# Patient Record
Sex: Male | Born: 1975 | Race: White | Hispanic: No | Marital: Married
Health system: Midwestern US, Community
[De-identification: ages and names within clinical notes are randomized; demographics above are authoritative.]

## PROBLEM LIST (undated history)

## (undated) DIAGNOSIS — Z8619 Personal history of other infectious and parasitic diseases: Secondary | ICD-10-CM

## (undated) DIAGNOSIS — D72819 Decreased white blood cell count, unspecified: Secondary | ICD-10-CM

## (undated) DIAGNOSIS — F411 Generalized anxiety disorder: Secondary | ICD-10-CM

## (undated) DIAGNOSIS — F429 Obsessive-compulsive disorder, unspecified: Secondary | ICD-10-CM

## (undated) DIAGNOSIS — I10 Essential (primary) hypertension: Secondary | ICD-10-CM

## (undated) DIAGNOSIS — F40243 Fear of flying: Secondary | ICD-10-CM

## (undated) DIAGNOSIS — F410 Panic disorder [episodic paroxysmal anxiety] without agoraphobia: Secondary | ICD-10-CM

## (undated) DIAGNOSIS — F32A Depression, unspecified: Secondary | ICD-10-CM

## (undated) DIAGNOSIS — F329 Major depressive disorder, single episode, unspecified: Secondary | ICD-10-CM

## (undated) DIAGNOSIS — R6882 Decreased libido: Secondary | ICD-10-CM

## (undated) DIAGNOSIS — T7840XA Allergy, unspecified, initial encounter: Secondary | ICD-10-CM

## (undated) HISTORY — DX: Panic disorder (episodic paroxysmal anxiety): F41.0

## (undated) HISTORY — DX: Essential (primary) hypertension: I10

## (undated) HISTORY — DX: Fear of flying: F40.243

## (undated) HISTORY — DX: Decreased libido: R68.82

## (undated) HISTORY — DX: Major depressive disorder, single episode, unspecified: F32.9

## (undated) HISTORY — DX: Depression, unspecified: F32.A

## (undated) HISTORY — DX: Decreased white blood cell count, unspecified: D72.819

## (undated) HISTORY — DX: Generalized anxiety disorder: F41.1

## (undated) HISTORY — DX: Allergy, unspecified, initial encounter: T78.40XA

## (undated) HISTORY — DX: Personal history of other infectious and parasitic diseases: Z86.19

## (undated) HISTORY — PX: VASECTOMY: SHX75

## (undated) HISTORY — DX: Obsessive-compulsive disorder, unspecified: F42.9

---

## 2001-09-15 HISTORY — PX: FUNCTIONAL ENDOSCOPIC SINUS SURGERY: SUR616

## 2008-09-15 HISTORY — PX: NASAL SINUS SURGERY: SHX719

## 2014-02-25 MED ORDER — IBUPROFEN 800 MG TAB
800 mg | ORAL_TABLET | Freq: Four times a day (QID) | ORAL | Status: AC | PRN
Start: 2014-02-25 — End: 2014-03-04

## 2014-02-25 NOTE — ED Notes (Signed)
Pt discharged by provider.

## 2014-02-25 NOTE — ED Notes (Signed)
Pt was playing racquetball and was hit in nose with ball at close range.   States septum moved to left after injury.  Hx of sinus surgery and septoplasty.

## 2014-02-25 NOTE — ED Notes (Signed)
Pt given ice pack.

## 2014-02-25 NOTE — ED Provider Notes (Signed)
Patient is a 38 y.o. male presenting with nasal problem. The history is provided by the patient.   Nasal Injury   The incident occurred less than 1 hour ago. He came to the ER via walk-in. The injury mechanism was a direct blow. The volume of blood lost was minimal. The pain is moderate. The pain has been constant since the injury. Pertinent negatives include no vomiting. He has tried nothing for the symptoms. There was no loss of consciousness.    38 yo M who was playing racketball and the ball came back and hit him in the head. Denies LOC. Now has swellin to upper lip and nose. ?small amount of blood. No NV. Has hx of sinus surgery x 2.     History reviewed. No pertinent past medical history.     Past Surgical History   Procedure Laterality Date   ??? Hx septoplasty           History reviewed. No pertinent family history.     History     Social History   ??? Marital Status: MARRIED     Spouse Name: N/A     Number of Children: N/A   ??? Years of Education: N/A     Occupational History   ??? Not on file.     Social History Main Topics   ??? Smoking status: Never Smoker    ??? Smokeless tobacco: Not on file   ??? Alcohol Use: Yes      Comment: once a week   ??? Drug Use: No   ??? Sexual Activity: Not on file     Other Topics Concern   ??? Not on file     Social History Narrative   ??? No narrative on file                  ALLERGIES: Review of patient's allergies indicates no known allergies.      Review of Systems   HENT: Positive for facial swelling and nosebleeds.    Respiratory: Negative for shortness of breath.    Gastrointestinal: Negative for nausea and vomiting.   Musculoskeletal: Negative for neck pain.   Skin: Positive for color change. Negative for wound.   Neurological: Negative for dizziness and headaches.       Filed Vitals:    02/25/14 1225   BP: 166/115   Pulse: 78   Temp: 98.5 ??F (36.9 ??C)   Resp: 16   Height: 5\' 8"  (1.727 m)   Weight: 78.019 kg (172 lb)   SpO2: 97%            Physical Exam   Constitutional: He is oriented  to person, place, and time. He appears well-developed and well-nourished. No distress.   HENT:   Head: Normocephalic.   Right Ear: External ear normal.   Left Ear: External ear normal.   ttp to bridge of nose no crepitus noted no septal hematoma noted  Upper lip midline swelling near frenulum    Eyes: Conjunctivae and EOM are normal. Pupils are equal, round, and reactive to light. Right eye exhibits no discharge. Left eye exhibits no discharge. No scleral icterus.   Neck: Normal range of motion. Neck supple. No tracheal deviation present.   Cardiovascular: Normal rate, normal heart sounds and intact distal pulses.    No murmur heard.  Pulmonary/Chest: Effort normal. No respiratory distress. He has no wheezes. He has no rales.   Abdominal: He exhibits no distension.   Musculoskeletal: Normal range of  motion.   Neurological: He is alert and oriented to person, place, and time. No cranial nerve deficit. Coordination normal.   Skin: Skin is warm. No rash noted. He is not diaphoretic. No erythema.   Psychiatric: He has a normal mood and affect. His behavior is normal. Judgment and thought content normal.   Nursing note and vitals reviewed.       MDM  Number of Diagnoses or Management Options  Diagnosis management comments: DD - bruise, frx        Amount and/or Complexity of Data Reviewed  Tests in the radiology section of CPT??: ordered    Risk of Complications, Morbidity, and/or Mortality  Presenting problems: low  Diagnostic procedures: low  Management options: low  General comments: Low suspicion for frx will get xray and dc accordingly         Procedures

## 2014-10-26 ENCOUNTER — Other Ambulatory Visit: Payer: Self-pay | Admitting: Family Medicine

## 2014-10-26 ENCOUNTER — Ambulatory Visit
Admission: RE | Admit: 2014-10-26 | Discharge: 2014-10-26 | Disposition: A | Discharge status: Home / Self Care | Payer: BC Managed Care – PPO | Source: Ambulatory Visit | Attending: Family Medicine | Admitting: Family Medicine

## 2014-10-26 DIAGNOSIS — R609 Edema, unspecified: Secondary | ICD-10-CM

## 2014-10-26 DIAGNOSIS — R52 Pain, unspecified: Secondary | ICD-10-CM

## 2015-12-21 DIAGNOSIS — F410 Panic disorder [episodic paroxysmal anxiety] without agoraphobia: Secondary | ICD-10-CM | POA: Diagnosis not present

## 2015-12-31 DIAGNOSIS — I10 Essential (primary) hypertension: Secondary | ICD-10-CM | POA: Diagnosis not present

## 2015-12-31 DIAGNOSIS — M792 Neuralgia and neuritis, unspecified: Secondary | ICD-10-CM | POA: Diagnosis not present

## 2015-12-31 DIAGNOSIS — F419 Anxiety disorder, unspecified: Secondary | ICD-10-CM | POA: Diagnosis not present

## 2015-12-31 DIAGNOSIS — F329 Major depressive disorder, single episode, unspecified: Secondary | ICD-10-CM | POA: Diagnosis not present

## 2016-01-07 DIAGNOSIS — F411 Generalized anxiety disorder: Secondary | ICD-10-CM | POA: Diagnosis not present

## 2016-01-07 DIAGNOSIS — I10 Essential (primary) hypertension: Secondary | ICD-10-CM | POA: Diagnosis not present

## 2016-01-07 DIAGNOSIS — K588 Other irritable bowel syndrome: Secondary | ICD-10-CM | POA: Diagnosis not present

## 2016-01-10 DIAGNOSIS — F411 Generalized anxiety disorder: Secondary | ICD-10-CM | POA: Diagnosis not present
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
## 2016-01-17 DIAGNOSIS — F411 Generalized anxiety disorder: Secondary | ICD-10-CM | POA: Diagnosis not present

## 2016-01-22 DIAGNOSIS — I10 Essential (primary) hypertension: Secondary | ICD-10-CM | POA: Diagnosis not present

## 2016-01-22 DIAGNOSIS — F411 Generalized anxiety disorder: Secondary | ICD-10-CM | POA: Diagnosis not present

## 2016-01-22 DIAGNOSIS — M792 Neuralgia and neuritis, unspecified: Secondary | ICD-10-CM | POA: Diagnosis not present

## 2016-01-24 DIAGNOSIS — F411 Generalized anxiety disorder: Secondary | ICD-10-CM | POA: Diagnosis not present

## 2016-01-31 DIAGNOSIS — F411 Generalized anxiety disorder: Secondary | ICD-10-CM | POA: Diagnosis not present

## 2016-02-14 DIAGNOSIS — F411 Generalized anxiety disorder: Secondary | ICD-10-CM | POA: Diagnosis not present

## 2016-02-21 DIAGNOSIS — F411 Generalized anxiety disorder: Secondary | ICD-10-CM | POA: Diagnosis not present

## 2016-02-27 DIAGNOSIS — M25572 Pain in left ankle and joints of left foot: Secondary | ICD-10-CM | POA: Diagnosis not present

## 2016-06-02 DIAGNOSIS — R05 Cough: Secondary | ICD-10-CM | POA: Diagnosis not present

## 2016-06-02 DIAGNOSIS — F418 Other specified anxiety disorders: Secondary | ICD-10-CM | POA: Diagnosis not present

## 2016-06-02 DIAGNOSIS — J0141 Acute recurrent pansinusitis: Secondary | ICD-10-CM | POA: Diagnosis not present

## 2016-06-10 DIAGNOSIS — R197 Diarrhea, unspecified: Secondary | ICD-10-CM | POA: Diagnosis not present

## 2016-06-10 DIAGNOSIS — F411 Generalized anxiety disorder: Secondary | ICD-10-CM | POA: Diagnosis not present

## 2016-06-17 DIAGNOSIS — A0472 Enterocolitis due to Clostridium difficile, not specified as recurrent: Secondary | ICD-10-CM | POA: Diagnosis not present

## 2016-06-17 DIAGNOSIS — R238 Other skin changes: Secondary | ICD-10-CM | POA: Diagnosis not present

## 2016-06-20 DIAGNOSIS — A0472 Enterocolitis due to Clostridium difficile, not specified as recurrent: Secondary | ICD-10-CM | POA: Diagnosis not present

## 2016-06-20 DIAGNOSIS — R238 Other skin changes: Secondary | ICD-10-CM | POA: Diagnosis not present

## 2016-07-05 DIAGNOSIS — A0472 Enterocolitis due to Clostridium difficile, not specified as recurrent: Secondary | ICD-10-CM | POA: Diagnosis not present

## 2016-07-05 DIAGNOSIS — F418 Other specified anxiety disorders: Secondary | ICD-10-CM | POA: Diagnosis not present

## 2016-07-06 DIAGNOSIS — Z114 Encounter for screening for human immunodeficiency virus [HIV]: Secondary | ICD-10-CM | POA: Diagnosis not present

## 2016-07-06 DIAGNOSIS — R5383 Other fatigue: Secondary | ICD-10-CM | POA: Diagnosis not present

## 2016-07-06 DIAGNOSIS — E559 Vitamin D deficiency, unspecified: Secondary | ICD-10-CM | POA: Diagnosis not present

## 2016-07-06 DIAGNOSIS — Z0000 Encounter for general adult medical examination without abnormal findings: Secondary | ICD-10-CM | POA: Diagnosis not present

## 2016-07-06 DIAGNOSIS — M25571 Pain in right ankle and joints of right foot: Secondary | ICD-10-CM | POA: Diagnosis not present

## 2016-07-16 DIAGNOSIS — F410 Panic disorder [episodic paroxysmal anxiety] without agoraphobia: Secondary | ICD-10-CM | POA: Diagnosis not present

## 2016-07-23 DIAGNOSIS — F5101 Primary insomnia: Secondary | ICD-10-CM | POA: Diagnosis not present

## 2016-07-23 DIAGNOSIS — F411 Generalized anxiety disorder: Secondary | ICD-10-CM | POA: Diagnosis not present

## 2016-07-23 DIAGNOSIS — I10 Essential (primary) hypertension: Secondary | ICD-10-CM | POA: Diagnosis not present

## 2016-07-23 DIAGNOSIS — Z23 Encounter for immunization: Secondary | ICD-10-CM | POA: Diagnosis not present

## 2016-07-29 DIAGNOSIS — Z8619 Personal history of other infectious and parasitic diseases: Secondary | ICD-10-CM | POA: Diagnosis not present

## 2016-09-26 DIAGNOSIS — N528 Other male erectile dysfunction: Secondary | ICD-10-CM | POA: Diagnosis not present

## 2016-09-26 DIAGNOSIS — Z3009 Encounter for other general counseling and advice on contraception: Secondary | ICD-10-CM | POA: Diagnosis not present

## 2016-10-13 DIAGNOSIS — I10 Essential (primary) hypertension: Secondary | ICD-10-CM | POA: Diagnosis not present

## 2016-10-13 DIAGNOSIS — F5101 Primary insomnia: Secondary | ICD-10-CM | POA: Diagnosis not present

## 2016-10-13 DIAGNOSIS — F411 Generalized anxiety disorder: Secondary | ICD-10-CM | POA: Diagnosis not present

## 2016-10-14 DIAGNOSIS — E7800 Pure hypercholesterolemia, unspecified: Secondary | ICD-10-CM | POA: Diagnosis not present

## 2016-10-14 DIAGNOSIS — E0590 Thyrotoxicosis, unspecified without thyrotoxic crisis or storm: Secondary | ICD-10-CM | POA: Diagnosis not present

## 2016-10-14 DIAGNOSIS — I10 Essential (primary) hypertension: Secondary | ICD-10-CM | POA: Diagnosis not present

## 2016-10-14 DIAGNOSIS — F411 Generalized anxiety disorder: Secondary | ICD-10-CM | POA: Diagnosis not present

## 2016-10-16 DIAGNOSIS — F411 Generalized anxiety disorder: Secondary | ICD-10-CM | POA: Diagnosis not present

## 2016-10-21 DIAGNOSIS — L0591 Pilonidal cyst without abscess: Secondary | ICD-10-CM | POA: Diagnosis not present

## 2016-10-22 DIAGNOSIS — F410 Panic disorder [episodic paroxysmal anxiety] without agoraphobia: Secondary | ICD-10-CM | POA: Diagnosis not present

## 2016-10-24 DIAGNOSIS — Z3009 Encounter for other general counseling and advice on contraception: Secondary | ICD-10-CM | POA: Diagnosis not present

## 2016-10-27 ENCOUNTER — Encounter: Payer: Self-pay | Admitting: General Surgery

## 2016-11-03 DIAGNOSIS — S61451A Open bite of right hand, initial encounter: Secondary | ICD-10-CM | POA: Diagnosis not present

## 2016-11-03 DIAGNOSIS — M25541 Pain in joints of right hand: Secondary | ICD-10-CM | POA: Diagnosis not present

## 2016-11-03 DIAGNOSIS — W540XXA Bitten by dog, initial encounter: Secondary | ICD-10-CM | POA: Diagnosis not present

## 2016-11-04 ENCOUNTER — Ambulatory Visit: Payer: Self-pay | Admitting: General Surgery

## 2016-11-12 ENCOUNTER — Ambulatory Visit: Payer: Self-pay | Admitting: General Surgery

## 2016-12-02 ENCOUNTER — Encounter: Payer: Self-pay | Admitting: Family Medicine

## 2016-12-15 ENCOUNTER — Encounter: Payer: Self-pay | Admitting: Family Medicine

## 2016-12-15 ENCOUNTER — Ambulatory Visit (INDEPENDENT_AMBULATORY_CARE_PROVIDER_SITE_OTHER): Payer: BLUE CROSS/BLUE SHIELD | Admitting: Family Medicine

## 2016-12-15 DIAGNOSIS — F32A Depression, unspecified: Secondary | ICD-10-CM | POA: Insufficient documentation

## 2016-12-15 DIAGNOSIS — F411 Generalized anxiety disorder: Secondary | ICD-10-CM | POA: Diagnosis not present

## 2016-12-15 DIAGNOSIS — K589 Irritable bowel syndrome without diarrhea: Secondary | ICD-10-CM | POA: Diagnosis not present

## 2016-12-15 DIAGNOSIS — F3342 Major depressive disorder, recurrent, in full remission: Secondary | ICD-10-CM

## 2016-12-15 DIAGNOSIS — I10 Essential (primary) hypertension: Secondary | ICD-10-CM

## 2016-12-15 DIAGNOSIS — M792 Neuralgia and neuritis, unspecified: Secondary | ICD-10-CM | POA: Insufficient documentation

## 2016-12-15 DIAGNOSIS — F329 Major depressive disorder, single episode, unspecified: Secondary | ICD-10-CM | POA: Insufficient documentation

## 2016-12-15 MED ORDER — CLONAZEPAM 0.5 MG PO TABS: ORAL_TABLET | ORAL | 0 refills | Status: DC

## 2016-12-15 NOTE — Assessment & Plan Note (Signed)
S: controlled on losartan HCT 100-12.5mg . Before visit was 143/83. Usually 130s to 140s over 80s.  BP Readings from Last 3 Encounters:  12/15/16 128/88  A/P:Continue current meds:  Doing reasonably well- would prefer all home #s under 140- discussed making sure to take when relaxed and rested

## 2016-12-15 NOTE — Assessment & Plan Note (Signed)
Gabapentin 300mg  BID for mouth nerve pain - through primary care but advised through dentistry. Would be willing to refill

## 2016-12-15 NOTE — Assessment & Plan Note (Signed)
For IBS will be seeing Functional medicine specialist- diet and IBS related visit coming up as well. Taking lomotil 4x a day. We did not refill this today but would be willing.

## 2016-12-15 NOTE — Patient Instructions (Signed)
We will start slow taper on clonazepam in anticipation of your visit with [facility]. Let's reduce to full tablet in the morning, half tablet in afternoon, and full tablet in the afternoon.   Glad you weaned yourself off the [place]- that seems like a positive step  Blood pressure better on repeat  We should see each other at some point for your yearly physical- just make sure at least a year out from last visit with [facility] for physical

## 2016-12-15 NOTE — Progress Notes (Signed)
Pre visit review using our clinic review tool, if applicable. No additional management support is needed unless otherwise documented below in the visit note.[id number]

## 2016-12-15 NOTE — Assessment & Plan Note (Addendum)
Depression S: Patient has had long term issues with anxiety. He was following with Letta Moynahan and prior PCP and at one point had rx written for clonazepam within 15 days by both office and ultimately was discharged from PCPs office. He was given this clonazepam refill when asking for a different short term benzo from PCP. Patient didn't feel like he ever got to explain situation to his PCP. Has been looking for 2 years for PCP.   Has had long term anxiety issues and has been on clonazepam for 15 years. Also on wellbutrin 150mg  XR and pristiq 100mg . Issues started around fear of flying and nhad to fly frequently with work.   He is wanting to transition off of klonopin due to his concerns of long term risks. He was also concerned about [place] and Weaned off [place] last month. Last dose was 3 weeks ago. Doing melatonin. Has some old trazodone for really bad nights.   For this wean, has May 9th appointment with [facility] Psychiatry Dr. Nyoka Cowden and Dr. Wynell Balloon on may 9th [phone]. . Functional medicine specialist- diet and IBS related visit coming up as well. Taking lomotil 4x a day. For IBS  Seeing Psychotherapist [facility] Dr. Henderson [place] in [place] late April.   Depression has been well controlled as has anxiety on this regimen fortunately. No SI.  A/P: Reviewed [place] database. Last fill of clonazepam #90 was 11/20/16 and he states he has a week left. Not taking xanax anymore.  He asks about starting the wean at this point. We planned for klonopin 0.5mg  AM, 0.25mg  afternoon, 0.5mg  PM over next month and if he fills this when other medicine runs out will get him to his next appointment.   Also scanned in document that he wrote to me explaining situation.

## 2016-12-15 NOTE — Progress Notes (Signed)
Phone: [phone]  Subjective:  Patient presents today to establish care.  Prior patient of [facility] physicians Dr. Dorthy Cooler. Chief complaint-noted.   See problem oriented charting  The following were reviewed and entered/updated in epic: Past Medical History:  Diagnosis Date  . Allergy    nasal lavave 3x a day. in past multiple meds- claritin, singulair, nasal spray.   . Depression    wellbutrin '150mg'$  XR, pristiq '100mg'$ . ambien '10mg'$  for sleep. clonazepam 1 tablet TID prn  . GAD (generalized anxiety disorder)    wellbutrin '150mg'$  XR, pristiq '100mg'$ . ambien '10mg'$  for sleep. clonazepam 1 tablet TID prn  . History of Clostridium difficile infection   . Hypertension    losartan HCT 100-12.'5mg'$  daily   Patient Active Problem List   Diagnosis Date Noted  . GAD (generalized anxiety disorder)     Priority: High  . IBS (irritable bowel syndrome) 12/15/2016    Priority: Medium  . Depression     Priority: Medium  . Hypertension     Priority: Medium  . Nerve pain 12/15/2016    Priority: Low   Past Surgical History:  Procedure Laterality Date  . FUNCTIONAL ENDOSCOPIC SINUS SURGERY  2003  . NASAL SINUS SURGERY  2010   baloonoplasty  . VASECTOMY      Family History  Problem Relation Age of Onset  . Other Mother     smoldering myeloma. precursor to multiple myeloma.   . Hypertension Father   . Anxiety disorder Father   . Colon cancer Father     53  . Lupus Sister     Medications- reviewed and updated Current Outpatient Prescriptions  Medication Sig Dispense Refill  . buPROPion (WELLBUTRIN XL) 150 MG 24 hr tablet Take 150 mg by mouth daily.    . clonazePAM (KLONOPIN) 0.5 MG tablet Take 1 tablet in AM, half tablet in afternoon, 1 tablet in PM 85 tablet 0  . desvenlafaxine (PRISTIQ) 100 MG 24 hr tablet Take 100 mg by mouth daily.    . diphenoxylate-atropine (LOMOTIL) 2.5-0.025 MG tablet Take 1 tablet by mouth 4 (four) times daily.    [place] Kitchen gabapentin (NEURONTIN) 300 MG capsule Take 300  mg by mouth 2 (two) times daily.    Mar[place] Kitchen losartan-hydrochlorothiazide (HYZAAR) 100-12.5 MG tablet Take 1 tablet by mouth daily.    . sildenafil (REVATIO) 20 MG tablet Take 20 mg by mouth as needed.    . traZODone (DESYREL) 50 MG tablet Take 50 mg by mouth at bedtime as needed for sleep.     No current facility-administered medications for this visit.     Allergies-reviewed and updated No Known Allergies  Social History   Social History  . Marital status: Married    Spouse name: N/A  . Number of children: N/A  . Years of education: N/A   Social History Main Topics  . Smoking status: Never Smoker  . Smokeless tobacco: Never Used  . Alcohol use 0.6 oz/week    1 Standard drinks or equivalent per week  . Drug use: No  . Sexual activity: Yes   Other Topics Concern  . None   Social History Narrative   Married 2002. 2 daughters sophie 62 and caroline 31 years old in 2018.       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  HENT: Negative for hearing loss and tinnitus.   Eyes: Negative for blurred vision and double vision.  Respiratory: Negative for cough and hemoptysis.   Cardiovascular: Negative for chest pain and palpitations.  Gastrointestinal: Negative for heartburn and nausea.  Genitourinary: Negative for dysuria and urgency.  Musculoskeletal: Negative for myalgias and neck pain.  Skin: Negative for itching and rash.  Neurological: Negative for dizziness and headaches.  Endo/Heme/Allergies: Negative for polydipsia. Does not bruise/bleed easily.  Psychiatric/Behavioral: Negative for depression, substance abuse and suicidal ideas.   Objective: BP 128/88   Pulse 100   Temp 98.2 F (36.8 C) (Oral)   Ht 5' 7.5" (1.715 m)   Wt 191 lb  12.8 oz (87 kg)   SpO2 96%   BMI 29.60 kg/m  Gen: NAD, resting comfortably HEENT: Mucous membranes are moist. Oropharynx normal. TM normal. Eyes: sclera and lids normal, PERRLA Neck: no thyromegaly, no cervical lymphadenopathy CV: RRR no murmurs rubs or gallops Lungs: CTAB no crackles, wheeze, rhonchi Abdomen: soft/nontender/nondistended/normal bowel sounds. No rebound or guarding.  Ext: no edema Skin: warm, dry Neuro: 5/5 strength in upper and lower extremities, normal gait, normal reflexes  Assessment/Plan:  Hypertension S: controlled on losartan HCT 100-12.'5mg'$ . Before visit was 143/83. Usually 130s to 140s over 80s.  BP Readings from Last 3 Encounters:  12/15/16 128/88  A/P:Continue current meds:  Doing reasonably well- would prefer all home #s under 140- discussed making sure to take when relaxed and rested  IBS (irritable bowel syndrome) For IBS will be seeing Functional medicine specialist- diet and IBS related visit coming up as well. Taking lomotil 4x a day. We did not refill this today but would be willing.   Nerve pain Gabapentin '300mg'$  BID for mouth nerve pain - through primary care but advised through dentistry. Would be willing to refill  GAD (generalized anxiety disorder) Depression S: Patient has had long term issues with anxiety. He was following with Letta Moynahan and prior PCP and at one point had rx written for clonazepam within 15 days by both office and ultimately was discharged from PCPs office. He was given this clonazepam refill when asking for a different short term benzo from PCP. Patient didn't feel like he ever got to explain situation to his PCP. Has been looking for 2 years for PCP.   Has had long term anxiety issues and has been on clonazepam for 15 years. Also on wellbutrin '150mg'$  XR and pristiq '100mg'$ . Issues started around fear of flying and nhad to fly frequently with work.   He is wanting to transition off of klonopin due to his concerns of long  term risks. He was also concerned about [place] and Weaned off [place] last month. Last dose was 3 weeks ago. Doing melatonin. Has some old trazodone for really bad nights.   For this wean, has May 9th appointment with [facility] Psychiatry Dr. Nyoka Cowden and Dr. Wynell Balloon on may 9th [phone]. . Functional medicine specialist- diet and IBS related visit coming up as well. Taking lomotil 4x a day. For IBS  Seeing Psychotherapist [facility] Dr. Henderson [place] in [place] late April.   Depression has been well controlled as has anxiety on this regimen fortunately. No SI.  A/P: Reviewed [place] database. Last fill of clonazepam #90 was 11/20/16 and he states he has a week left. Not taking xanax anymore.  He asks about starting the wean at this point. We  planned for klonopin 0.'5mg'$  AM, 0.'25mg'$  afternoon, 0.'5mg'$  PM over next month and if he fills this when other medicine runs out will get him to his next appointment.   Also scanned in document that he wrote to me explaining situation.   Physical within next few months   Meds ordered this encounter  Medications  . desvenlafaxine (PRISTIQ) 100 MG 24 hr tablet    Sig: Take 100 mg by mouth daily.  [place] Kitchen buPROPion (WELLBUTRIN XL) 150 MG 24 hr tablet    Sig: Take 150 mg by mouth daily.  [place] Kitchen gabapentin (NEURONTIN) 300 MG capsule    Sig: Take 300 mg by mouth 2 (two) times daily.  . diphenoxylate-atropine (LOMOTIL) 2.5-0.025 MG tablet    Sig: Take 1 tablet by mouth 4 (four) times daily.  Mar[place] Kitchen losartan-hydrochlorothiazide (HYZAAR) 100-12.5 MG tablet    Sig: Take 1 tablet by mouth daily.  . sildenafil (REVATIO) 20 MG tablet    Sig: Take 20 mg by mouth as needed.  Mar[place] Kitchen DISCONTD: clonazePAM (KLONOPIN) 0.5 MG tablet    Sig: Take 0.5 mg by mouth 3 (three) times daily as needed for anxiety.  . traZODone (DESYREL) 50 MG tablet    Sig: Take 50 mg by mouth at bedtime as needed for sleep.  . clonazePAM (KLONOPIN) 0.5 MG tablet    Sig: Take 1 tablet in AM, half  tablet in afternoon, 1 tablet in PM    Dispense:  85 tablet    Refill:  0   Return precautions advised. Garret Reddish, MD

## 2016-12-24 ENCOUNTER — Encounter: Payer: Self-pay | Admitting: Family Medicine

## 2016-12-25 ENCOUNTER — Encounter: Payer: Self-pay | Admitting: *Deleted

## 2016-12-29 ENCOUNTER — Other Ambulatory Visit: Payer: Self-pay

## 2016-12-29 MED ORDER — LOSARTAN POTASSIUM-HCTZ 100-12.5 MG PO TABS: 1.0000 | ORAL_TABLET | Freq: Every day | ORAL | 3 refills | Status: DC

## 2016-12-29 MED ORDER — GABAPENTIN 300 MG PO CAPS: 300.0000 mg | ORAL_CAPSULE | Freq: Two times a day (BID) | ORAL | 3 refills | Status: AC

## 2016-12-29 MED ORDER — SILDENAFIL CITRATE 20 MG PO TABS: 20.0000 mg | ORAL_TABLET | ORAL | 3 refills | Status: DC | PRN

## 2017-01-19 ENCOUNTER — Encounter: Payer: Self-pay | Admitting: Family Medicine

## 2017-01-19 ENCOUNTER — Other Ambulatory Visit: Payer: Self-pay

## 2017-01-19 MED ORDER — CLONAZEPAM 0.5 MG PO TABS: ORAL_TABLET | ORAL | 0 refills | Status: AC

## 2017-01-21 DIAGNOSIS — F429 Obsessive-compulsive disorder, unspecified: Secondary | ICD-10-CM | POA: Diagnosis not present

## 2017-01-21 DIAGNOSIS — F410 Panic disorder [episodic paroxysmal anxiety] without agoraphobia: Secondary | ICD-10-CM | POA: Diagnosis not present

## 2017-01-29 ENCOUNTER — Encounter: Payer: Self-pay | Admitting: Family Medicine

## 2017-01-29 ENCOUNTER — Other Ambulatory Visit: Payer: Self-pay | Admitting: Family Medicine

## 2017-01-29 MED ORDER — HYDROXYZINE HCL 10 MG PO TABS
10.0000 mg | ORAL_TABLET | Freq: Three times a day (TID) | ORAL | 0 refills | Status: DC | PRN
Start: 2017-01-29 — End: 2018-10-04

## 2017-01-29 NOTE — Progress Notes (Signed)
atararax

## 2017-02-03 ENCOUNTER — Encounter: Payer: Self-pay | Admitting: Family Medicine

## 2017-02-11 ENCOUNTER — Ambulatory Visit (INDEPENDENT_AMBULATORY_CARE_PROVIDER_SITE_OTHER): Payer: BLUE CROSS/BLUE SHIELD | Admitting: Family Medicine

## 2017-02-11 ENCOUNTER — Encounter: Payer: Self-pay | Admitting: Gastroenterology

## 2017-02-11 ENCOUNTER — Encounter: Payer: Self-pay | Admitting: Family Medicine

## 2017-02-11 VITALS — BP 128/84 | HR 107 | Temp 98.8°F | Ht 67.5 in | Wt 195.0 lb

## 2017-02-11 DIAGNOSIS — L649 Androgenic alopecia, unspecified: Secondary | ICD-10-CM | POA: Diagnosis not present

## 2017-02-11 DIAGNOSIS — Z83719 Family history of colon polyps, unspecified: Secondary | ICD-10-CM

## 2017-02-11 DIAGNOSIS — Z8371 Family history of colonic polyps: Secondary | ICD-10-CM | POA: Diagnosis not present

## 2017-02-11 DIAGNOSIS — F3342 Major depressive disorder, recurrent, in full remission: Secondary | ICD-10-CM

## 2017-02-11 DIAGNOSIS — Z23 Encounter for immunization: Secondary | ICD-10-CM | POA: Diagnosis not present

## 2017-02-11 NOTE — Patient Instructions (Signed)
We opted out of propecia just with need to take lifelong  Can use rogaine type product but same issue there- may have significant loss if stop  Consider [facility] consult for timing of first colonoscopy. I think 45 would be reasonable.   Monitor spot on forehead at each physical

## 2017-02-11 NOTE — Assessment & Plan Note (Signed)
[facility] at [facility] now- Off wellbutrin, tapering klonopin, quit ambien- trazodone in its place. Xanax for flying.

## 2017-02-11 NOTE — Assessment & Plan Note (Signed)
S: patient with hair loss gradually over last several years. He is concerned this contributes to sun exposure due to less area of coverage. He is aware of need for sunscreen and is regularly using this. He asks if a spot on his left forehead is an actinic keratosis- which his father has had many of.   He also asks about Rogaine which he has been using as well as propecia A/P: long conversation about options today. He is concerned of risks of loss if he doesn't permanently take Rogaine or Propecia/finasteride (also worried abotu sexual side effects) and ultimately opts out. As for 2.5 mm lesion- appears to be small seborrheic keratosis and we will monitor this.

## 2017-02-11 NOTE — Assessment & Plan Note (Signed)
Dad with tubular adenoma age 41. He asks about early screening for colon cancer and we discussed age 3 likely reasonable. He is about to turn 41 and wonders about earlier screening. After visit, discovered new ACS guidelines of starting at age 80 - called him to inform of this.   He is considering a GI consult to consider earlier than age 38 screening. I think it is reasonable for him to sit down and discuss this. I advised [facility].

## 2017-02-11 NOTE — Progress Notes (Signed)
Subjective:  Jose Berry is a 41 y.o. year old very pleasant male patient who presents for/with See problem oriented charting ROS- complains of slow long term gradual hair loss in male pattern. No erythematous or scaly lesions   Past Medical History-  Patient Active Problem List   Diagnosis Date Noted  . GAD (generalized anxiety disorder)     Priority: High  . IBS (irritable bowel syndrome) 12/15/2016    Priority: Medium  . Depression     Priority: Medium  . Hypertension     Priority: Medium  . Nerve pain 12/15/2016    Priority: Low    Medications- reviewed and updated Current Outpatient Prescriptions  Medication Sig Dispense Refill  . ALPRAZolam (XANAX) 0.5 MG tablet Take 0.5 mg by mouth as needed. Takes prior to flights    . clonazePAM (KLONOPIN) 0.5 MG tablet Take 1 tablet in AM, half tablet in afternoon, 1 tablet in PM 4 tablet 0  . desvenlafaxine (PRISTIQ) 100 MG 24 hr tablet Take 100 mg by mouth daily.    . diphenoxylate-atropine (LOMOTIL) 2.5-0.025 MG tablet Take 1 tablet by mouth 4 (four) times daily.    [place] Kitchen gabapentin (NEURONTIN) 300 MG capsule Take 1 capsule (300 mg total) by mouth 2 (two) times daily. 90 capsule 3  . hydrOXYzine (ATARAX/VISTARIL) 10 MG tablet Take 1 tablet (10 mg total) by mouth 3 (three) times daily as needed. 30 tablet 0  . losartan-hydrochlorothiazide (HYZAAR) 100-12.5 MG tablet Take 1 tablet by mouth daily. 90 tablet 3  . sildenafil (REVATIO) 20 MG tablet Take 1 tablet (20 mg total) by mouth as needed. 30 tablet 3  . traZODone (DESYREL) 50 MG tablet Take 50 mg by mouth at bedtime as needed for sleep.    Mar[place] Kitchen buPROPion (WELLBUTRIN XL) 150 MG 24 hr tablet Take 150 mg by mouth daily.     No current facility-administered medications for this visit.     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 He is concerned this contributes to sun exposure due to less area of coverage. He is aware of need for sunscreen and is regularly using this. He asks if a spot on his left forehead is an actinic keratosis- which his father has had many of.   He also asks about Rogaine which he has been using as well as propecia A/P: long conversation about options today. He is concerned of risks of loss if he doesn't permanently take Rogaine or Propecia/finasteride (also worried abotu sexual side effects) and ultimately opts out. As for 2.5 mm lesion- appears to be small seborrheic keratosis and we will monitor this.   Family history of colonic polyps Dad with tubular adenoma age 45. He asks about early screening for colon cancer and we discussed age 56 likely reasonable. He is about to turn 41 and wonders about earlier screening. After visit, discovered new ACS guidelines of starting at age 39 - called him to inform of this.  He is considering a GI consult to consider earlier than age 40 screening. I think it is reasonable for him to sit down and discuss this. I advised [facility].   Orders Placed This Encounter  Procedures  . Tdap vaccine greater than or equal to 7yo IM    Meds ordered this encounter  Medications  . ALPRAZolam (XANAX) 0.5 MG tablet    Sig: Take 0.5 mg by mouth as needed. Takes prior to flights    Return precautions advised.  Garret Reddish, MD

## 2017-02-16 ENCOUNTER — Encounter: Payer: Self-pay | Admitting: Family Medicine

## 2017-02-21 DIAGNOSIS — K580 Irritable bowel syndrome with diarrhea: Secondary | ICD-10-CM | POA: Diagnosis not present

## 2017-02-23 ENCOUNTER — Encounter: Payer: Self-pay | Admitting: Family Medicine

## 2017-02-23 ENCOUNTER — Other Ambulatory Visit: Payer: Self-pay | Admitting: *Deleted

## 2017-02-23 MED ORDER — SILDENAFIL CITRATE 20 MG PO TABS: 20.0000 mg | ORAL_TABLET | ORAL | 0 refills | Status: DC | PRN

## 2017-02-23 NOTE — Telephone Encounter (Signed)
Rx done. 

## 2017-02-27 ENCOUNTER — Encounter: Payer: BLUE CROSS/BLUE SHIELD | Admitting: Family Medicine

## 2017-03-20 ENCOUNTER — Ambulatory Visit: Payer: BLUE CROSS/BLUE SHIELD | Admitting: Gastroenterology

## 2017-03-20 ENCOUNTER — Telehealth: Payer: Self-pay | Admitting: Gastroenterology

## 2017-03-21 ENCOUNTER — Encounter: Payer: Self-pay | Admitting: Family Medicine

## 2017-04-03 ENCOUNTER — Ambulatory Visit: Payer: Self-pay | Admitting: Family Medicine

## 2017-04-13 DIAGNOSIS — F429 Obsessive-compulsive disorder, unspecified: Secondary | ICD-10-CM | POA: Diagnosis not present

## 2017-04-13 DIAGNOSIS — F411 Generalized anxiety disorder: Secondary | ICD-10-CM | POA: Diagnosis not present

## 2017-04-13 DIAGNOSIS — F410 Panic disorder [episodic paroxysmal anxiety] without agoraphobia: Secondary | ICD-10-CM | POA: Diagnosis not present

## 2017-04-14 DIAGNOSIS — F411 Generalized anxiety disorder: Secondary | ICD-10-CM | POA: Diagnosis not present

## 2017-04-15 ENCOUNTER — Encounter: Payer: Self-pay | Admitting: Family Medicine

## 2017-04-20 ENCOUNTER — Ambulatory Visit: Payer: BLUE CROSS/BLUE SHIELD | Admitting: Family Medicine

## 2017-05-06 ENCOUNTER — Encounter: Payer: Self-pay | Admitting: Family Medicine

## 2017-05-07 ENCOUNTER — Other Ambulatory Visit: Payer: Self-pay

## 2017-05-07 MED ORDER — MOMETASONE FUROATE 50 MCG/ACT NA SUSP: 2.0000 | Freq: Every day | NASAL | 2 refills | Status: DC

## 2017-05-08 ENCOUNTER — Ambulatory Visit: Payer: BLUE CROSS/BLUE SHIELD | Admitting: Gastroenterology

## 2017-05-21 ENCOUNTER — Encounter: Payer: Self-pay | Admitting: Family Medicine

## 2017-06-08 ENCOUNTER — Encounter: Payer: Self-pay | Admitting: Family Medicine

## 2017-06-10 ENCOUNTER — Other Ambulatory Visit: Payer: Self-pay

## 2017-06-10 DIAGNOSIS — L720 Epidermal cyst: Secondary | ICD-10-CM

## 2017-06-17 ENCOUNTER — Encounter: Payer: Self-pay | Admitting: Family Medicine

## 2017-06-17 ENCOUNTER — Ambulatory Visit (INDEPENDENT_AMBULATORY_CARE_PROVIDER_SITE_OTHER): Payer: BLUE CROSS/BLUE SHIELD | Admitting: Family Medicine

## 2017-06-17 VITALS — BP 132/88 | HR 84 | Temp 98.6°F | Ht 67.5 in | Wt 189.4 lb

## 2017-06-17 DIAGNOSIS — Z7251 High risk heterosexual behavior: Secondary | ICD-10-CM

## 2017-06-17 DIAGNOSIS — Z8371 Family history of colonic polyps: Secondary | ICD-10-CM

## 2017-06-17 DIAGNOSIS — I10 Essential (primary) hypertension: Secondary | ICD-10-CM | POA: Diagnosis not present

## 2017-06-17 DIAGNOSIS — F411 Generalized anxiety disorder: Secondary | ICD-10-CM

## 2017-06-17 DIAGNOSIS — Z0000 Encounter for general adult medical examination without abnormal findings: Secondary | ICD-10-CM | POA: Diagnosis not present

## 2017-06-17 DIAGNOSIS — F3342 Major depressive disorder, recurrent, in full remission: Secondary | ICD-10-CM

## 2017-06-17 DIAGNOSIS — Z23 Encounter for immunization: Secondary | ICD-10-CM | POA: Diagnosis not present

## 2017-06-17 LAB — LIPID PANEL
CHOLESTEROL: 249 mg/dL — AB (ref 0–200)
HDL: 72.5 mg/dL (ref >39.00)
LDL Cholesterol: 156 mg/dL — ABNORMAL HIGH (ref 0–99)
NonHDL: 176.47
TRIGLYCERIDES: 104 mg/dL (ref 0.0–149.0)
Total CHOL/HDL Ratio: 3
VLDL: 20.8 mg/dL (ref 0.0–40.0)

## 2017-06-17 LAB — COMPREHENSIVE METABOLIC PANEL
ALK PHOS: 50 U/L (ref 39–117)
ALT: 13 U/L (ref 0–53)
AST: 17 U/L (ref 0–37)
Albumin: 5.3 g/dL — ABNORMAL HIGH (ref 3.5–5.2)
BUN: 12 mg/dL (ref 6–23)
CALCIUM: 10.4 mg/dL (ref 8.4–10.5)
CO2: 32 meq/L (ref 19–32)
Chloride: 99 mEq/L (ref 96–112)
Creatinine, Ser: 1.06 mg/dL (ref 0.40–1.50)
GFR: 81.69 mL/min (ref >60.00)
GLUCOSE: 96 mg/dL (ref 70–99)
POTASSIUM: 4.7 meq/L (ref 3.5–5.1)
SODIUM: 138 meq/L (ref 135–145)
Total Bilirubin: 0.7 mg/dL (ref 0.2–1.2)
Total Protein: 8 g/dL (ref 6.0–8.3)

## 2017-06-17 LAB — TSH: TSH: 2 u[IU]/mL (ref 0.35–4.50)

## 2017-06-17 LAB — CBC
HCT: 43 % (ref 39.0–52.0)
Hemoglobin: 14.6 g/dL (ref 13.0–17.0)
MCHC: 33.9 g/dL (ref 30.0–36.0)
MCV: 96.6 fl (ref 78.0–100.0)
PLATELETS: 345 10*3/uL (ref 150.0–400.0)
RBC: 4.45 Mil/uL (ref 4.22–5.81)
RDW: 12.7 % (ref 11.5–15.5)
WBC: 3.1 10*3/uL — AB (ref 4.0–10.5)

## 2017-06-17 MED ORDER — MOMETASONE FUROATE 50 MCG/ACT NA SUSP: 2.0000 | Freq: Every day | NASAL | 3 refills | Status: DC

## 2017-06-17 MED ORDER — SILDENAFIL CITRATE 20 MG PO TABS: 40.0000 mg | ORAL_TABLET | Freq: Every day | ORAL | 3 refills | Status: DC | PRN

## 2017-06-17 NOTE — Patient Instructions (Signed)
Glad you are doing well  [place] job with weight loss!   Keep up the good work  Lets check in 6 months from now

## 2017-06-17 NOTE — Progress Notes (Signed)
Phone: [phone]  Subjective:  Patient presents today for their annual physical. Chief complaint-noted.   See problem oriented charting- ROS- full  review of systems was completed and negative including No chest pain or shortness of breath. No headache or blurry vision.   The following were reviewed and entered/updated in epic: Past Medical History:  Diagnosis Date  . Allergy    nasal lavave 3x a day. in past multiple meds- claritin, singulair, nasal spray.   . Depression    wellbutrin '150mg'$  XR, pristiq '100mg'$ . ambien '10mg'$  for sleep. clonazepam 1 tablet TID prn  . GAD (generalized anxiety disorder)    wellbutrin '150mg'$  XR, pristiq '100mg'$ . ambien '10mg'$  for sleep. clonazepam 1 tablet TID prn  . History of Clostridium difficile infection   . Hypertension    losartan HCT 100-12.'5mg'$  daily   Patient Active Problem List   Diagnosis Date Noted  . Family history of colonic polyps 02/11/2017    Priority: High  . GAD (generalized anxiety disorder)     Priority: High  . IBS (irritable bowel syndrome) 12/15/2016    Priority: Medium  . Depression     Priority: Medium  . Hypertension     Priority: Medium  . 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 Nerve pain 12/15/2016    Priority: Low   Past Surgical History:  Procedure Laterality Date  . FUNCTIONAL ENDOSCOPIC SINUS SURGERY  2003  . NASAL SINUS SURGERY  2010   baloonoplasty  . VASECTOMY      Family History  Problem Relation Age of Onset  . Other Mother        smoldering myeloma. precursor to multiple myeloma.   . Hypertension Father   . Anxiety disorder Father   . Colon polyps Father        tubular adenoma  . Lupus Sister     Medications- reviewed and updated Current Outpatient Prescriptions  Medication Sig Dispense Refill  . ALPRAZolam (XANAX) 0.5 MG tablet Take 0.5 mg by mouth as needed. Takes prior to flights    . buPROPion (WELLBUTRIN XL) 150 MG 24 hr tablet Take 150 mg by mouth daily.    . clonazePAM  (KLONOPIN) 0.5 MG tablet Take 1 tablet in AM, half tablet in afternoon, 1 tablet in PM (Patient taking differently: Take 1/2 tablet in AM and 1/2 tablet in PM) 4 tablet 0  . desvenlafaxine (PRISTIQ) 100 MG 24 hr tablet Take 100 mg by mouth daily.    Marland Kitchen gabapentin (NEURONTIN) 300 MG capsule Take 1 capsule (300 mg total) by mouth 2 (two) times daily. (Patient taking differently: Take 300 mg by mouth 3 (three) times daily. 1 in the AM, ! In the afternoon, 2 in the PM) 90 capsule 3  . hydrOXYzine (ATARAX/VISTARIL) 10 MG tablet Take 1 tablet (10 mg total) by mouth 3 (three) times daily as needed. 30 tablet 0  . losartan-hydrochlorothiazide (HYZAAR) 100-12.5 MG tablet Take 1 tablet by mouth daily. 90 tablet 3  . mometasone (NASONEX) 50 MCG/ACT nasal spray Place 2 sprays into the nose daily. 51 g 3  . sildenafil (REVATIO) 20 MG tablet Take 2-5 tablets (40-100 mg total) by mouth daily as needed. 100 tablet 3  . traZODone (DESYREL) 50 MG tablet Take 50 mg by mouth at bedtime as needed for sleep.     No current facility-administered medications for this visit.     Allergies-reviewed and updated No Known Allergies  Social History   Social History  . Marital status:  Married    Spouse name: N/A  . Number of children: N/A  . Years of education: N/A   Social History Main Topics  . Smoking status: Never Smoker  . Smokeless tobacco: Never Used  . Alcohol use 0.6 oz/week    1 Standard drinks or equivalent per week  . Drug use: No  . Sexual activity: Yes   Other Topics Concern  . None   Social History Narrative   Married 2002. 2 daughters sophie 42 and caroline 13 years old in 2018.       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 Oropharynx normal Neck: no thyromegaly CV: RRR no murmurs rubs or gallops Lungs: CTAB no crackles, wheeze, rhonchi Abdomen: soft/nontender/nondistended/normal bowel sounds. No rebound or guarding.  Ext: no edema Skin: warm, dry, sweaty on exam Neuro: grossly normal, moves all extremities, PERRLA  Assessment/Plan:  41 y.o. male presenting for annual physical.  Health Maintenance counseling: 1. Anticipatory guidance: Patient counseled regarding regular dental exams -q6 months, eye exams - has seen within last 2 years- stress induced eye strain, wearing seatbelts.  2. Risk factor reduction:  Advised patient of need for regular exercise and diet rich and fruits and vegetables to reduce risk of heart attack and stroke. Exercise- 5 days a week. Diet-weight down 6 lbs- eating better as well. Wants to lose another 10 lbs.  Wt Readings from Last 3 Encounters:  06/17/17 189 lb 6.4 oz (85.9 kg)  02/11/17 195 lb (88.5 kg)  12/15/16 191 lb 12.8 oz (87 kg)  3. Immunizations/screenings/ancillary studies- flu shot given today.  Immunization History  Administered Date(s) Administered  . Influenza,inj,Quad PF,6+ Mos 06/17/2017  . Influenza-Unspecified 07/30/2016  . Tdap 02/11/2017  4. Prostate cancer screening- no family history, start at age 9-55  5. Colon cancer screening - no family history of cancer but dad with adenoma at age 69 or 65. We will start with colon cancer screening at age 74 as a result.  6. Skin cancer screening/prevention- advised regular sunscreen use. Denies worrisome, changing, or new skin lesions. Using some old Retin-A 7. Testicular cancer screening- advised monthly self exams  8. STD screening- patient opts out - low risk monogamous   Status of chronic or acute concerns   GAD and depression- managed y [facility] on pristiq, trazodone for sleep. Both reasonably well controlled  HTN-  controlled on losartan HCT 100-12.'5mg'$ . #s even better at home 125/81.   IBS- no longer on lomotil- wants to get off controlled substances  Left ankle prior sprain- working on some PT- so easy for him to tweak this this  Down from 90 a month to 60 a month clonazepam per taper from [facility]- feels more twitching, irritable. Also on propranolol and gabapenti Had been on medicine for 15 years. Meditation twice a day  Nodule/potential deep cyst left side of nose- seems to be enlarging. Have referred to skin surgery center for evaluation.   Future Appointments Date Time Provider [place]  07/01/2017 8:45 AM Loletha Carrow, Estill Cotta  III, MD [facility]   6 months  Orders Placed This Encounter  Procedures  . Flu Vaccine QUAD 36+ mos IM  . Lipid panel    [place]    Order Specific Question:   Has the patient fasted?    Answer:   No  . CBC    [place]  . Comprehensive metabolic panel    [place]    Order Specific Question:   Has the patient fasted?    Answer:   No  . TSH      . HIV antibody    Meds ordered this encounter  Medications  . mometasone (NASONEX) 50 MCG/ACT nasal spray    Sig: Place 2 sprays into the nose daily.    Dispense:  51 g    Refill:  3  . sildenafil (REVATIO) 20 MG tablet    Sig: Take 2-5 tablets (40-100 mg total) by mouth daily as needed.    Dispense:  100 tablet    Refill:  3    Return precautions advised.   Garret Reddish, MD

## 2017-06-18 ENCOUNTER — Telehealth: Payer: Self-pay

## 2017-06-18 ENCOUNTER — Other Ambulatory Visit: Payer: Self-pay

## 2017-06-18 DIAGNOSIS — D72819 Decreased white blood cell count, unspecified: Secondary | ICD-10-CM

## 2017-06-18 LAB — HIV ANTIBODY (ROUTINE TESTING W REFLEX): HIV 1&2 Ab, 4th Generation: NONREACTIVE

## 2017-06-18 NOTE — Telephone Encounter (Signed)
Prior Authorization submitted for sildenafil (REVATIO) 20 MG tablet. Awaiting response at this time.

## 2017-07-01 ENCOUNTER — Ambulatory Visit: Payer: BLUE CROSS/BLUE SHIELD | Admitting: Gastroenterology

## 2017-07-16 DIAGNOSIS — F40243 Fear of flying: Secondary | ICD-10-CM | POA: Diagnosis not present

## 2017-07-16 DIAGNOSIS — F410 Panic disorder [episodic paroxysmal anxiety] without agoraphobia: Secondary | ICD-10-CM | POA: Diagnosis not present

## 2017-07-16 DIAGNOSIS — F411 Generalized anxiety disorder: Secondary | ICD-10-CM | POA: Diagnosis not present

## 2017-07-16 DIAGNOSIS — F429 Obsessive-compulsive disorder, unspecified: Secondary | ICD-10-CM | POA: Diagnosis not present

## 2017-07-22 ENCOUNTER — Other Ambulatory Visit (INDEPENDENT_AMBULATORY_CARE_PROVIDER_SITE_OTHER): Payer: BLUE CROSS/BLUE SHIELD

## 2017-07-22 ENCOUNTER — Encounter: Payer: Self-pay | Admitting: Family Medicine

## 2017-07-22 DIAGNOSIS — D72819 Decreased white blood cell count, unspecified: Secondary | ICD-10-CM | POA: Diagnosis not present

## 2017-07-22 LAB — CBC WITH DIFFERENTIAL/PLATELET
BASOS ABS: 0 10*3/uL (ref 0.0–0.1)
Basophils Relative: 1 % (ref 0.0–3.0)
EOS PCT: 1.8 % (ref 0.0–5.0)
Eosinophils Absolute: 0.1 10*3/uL (ref 0.0–0.7)
HEMATOCRIT: 40.5 % (ref 39.0–52.0)
HEMOGLOBIN: 14.1 g/dL (ref 13.0–17.0)
LYMPHS PCT: 41.4 % (ref 12.0–46.0)
Lymphs Abs: 1.1 10*3/uL (ref 0.7–4.0)
MCHC: 34.7 g/dL (ref 30.0–36.0)
MCV: 95.4 fl (ref 78.0–100.0)
Monocytes Absolute: 0.2 10*3/uL (ref 0.1–1.0)
Monocytes Relative: 8.1 % (ref 3.0–12.0)
NEUTROS PCT: 47.7 % (ref 43.0–77.0)
Neutro Abs: 1.3 10*3/uL — ABNORMAL LOW (ref 1.4–7.7)
Platelets: 316 10*3/uL (ref 150.0–400.0)
RBC: 4.25 Mil/uL (ref 4.22–5.81)
RDW: 12.2 % (ref 11.5–15.5)
WBC: 2.7 10*3/uL — AB (ref 4.0–10.5)

## 2017-07-24 ENCOUNTER — Other Ambulatory Visit: Payer: Self-pay

## 2017-07-24 DIAGNOSIS — D72819 Decreased white blood cell count, unspecified: Secondary | ICD-10-CM

## 2017-08-03 ENCOUNTER — Telehealth: Payer: Self-pay | Admitting: *Deleted

## 2017-08-03 MED ORDER — SILDENAFIL CITRATE 100 MG PO TABS: 100.0000 mg | ORAL_TABLET | Freq: Every day | ORAL | 5 refills | Status: DC | PRN

## 2017-08-03 NOTE — Telephone Encounter (Signed)
Left message on voicemail to call office.  

## 2017-08-03 NOTE — Telephone Encounter (Signed)
Pt called back, told him received deniel letter from insurance regarding Revatio. Told him Dr. Yong Channel said can send in Viagra 100 mg to take daily as needed. Pt verbalized understanding and said that would be fine. Told him okay will send Rx.  Pt verbalized understanding. Rx sent.

## 2017-09-17 ENCOUNTER — Encounter: Payer: Self-pay | Admitting: Family Medicine

## 2017-10-21 DIAGNOSIS — F40243 Fear of flying: Secondary | ICD-10-CM | POA: Diagnosis not present

## 2017-10-21 DIAGNOSIS — F411 Generalized anxiety disorder: Secondary | ICD-10-CM | POA: Diagnosis not present

## 2017-10-21 DIAGNOSIS — F410 Panic disorder [episodic paroxysmal anxiety] without agoraphobia: Secondary | ICD-10-CM | POA: Diagnosis not present

## 2017-10-21 DIAGNOSIS — F429 Obsessive-compulsive disorder, unspecified: Secondary | ICD-10-CM | POA: Diagnosis not present

## 2017-10-26 ENCOUNTER — Encounter: Payer: Self-pay | Admitting: Family Medicine

## 2017-10-26 ENCOUNTER — Other Ambulatory Visit (INDEPENDENT_AMBULATORY_CARE_PROVIDER_SITE_OTHER): Payer: BLUE CROSS/BLUE SHIELD

## 2017-10-26 DIAGNOSIS — D72819 Decreased white blood cell count, unspecified: Secondary | ICD-10-CM | POA: Diagnosis not present

## 2017-10-26 NOTE — Addendum Note (Signed)
Addended by: Frutoso Chase A on: 10/26/2017 08:23 AM   Modules accepted: Orders

## 2017-10-27 ENCOUNTER — Encounter: Payer: Self-pay | Admitting: Family Medicine

## 2017-10-27 LAB — CBC WITH DIFFERENTIAL/PLATELET
BASOS ABS: 29 {cells}/uL (ref 0–200)
BASOS PCT: 0.8 %
EOS ABS: 79 {cells}/uL (ref 15–500)
Eosinophils Relative: 2.2 %
HEMATOCRIT: 38.3 % — AB (ref 38.5–50.0)
HEMOGLOBIN: 13.6 g/dL (ref 13.2–17.1)
LYMPHS ABS: 1559 {cells}/uL (ref 850–3900)
MCH: 32.3 pg (ref 27.0–33.0)
MCHC: 35.5 g/dL (ref 32.0–36.0)
MCV: 91 fL (ref 80.0–100.0)
MPV: 10 fL (ref 7.5–12.5)
Monocytes Relative: 8.4 %
NEUTROS ABS: 1631 {cells}/uL (ref 1500–7800)
Neutrophils Relative %: 45.3 %
Platelets: 322 10*3/uL (ref 140–400)
RBC: 4.21 10*6/uL (ref 4.20–5.80)
RDW: 11.8 % (ref 11.0–15.0)
Total Lymphocyte: 43.3 %
WBC mixed population: 302 cells/uL (ref 200–950)
WBC: 3.6 10*3/uL — ABNORMAL LOW (ref 3.8–10.8)

## 2017-11-24 DIAGNOSIS — L814 Other melanin hyperpigmentation: Secondary | ICD-10-CM | POA: Diagnosis not present

## 2017-11-24 DIAGNOSIS — L988 Other specified disorders of the skin and subcutaneous tissue: Secondary | ICD-10-CM | POA: Diagnosis not present

## 2017-11-24 DIAGNOSIS — D2239 Melanocytic nevi of other parts of face: Secondary | ICD-10-CM | POA: Diagnosis not present

## 2017-11-24 DIAGNOSIS — D485 Neoplasm of uncertain behavior of skin: Secondary | ICD-10-CM | POA: Diagnosis not present

## 2017-11-24 DIAGNOSIS — D225 Melanocytic nevi of trunk: Secondary | ICD-10-CM | POA: Diagnosis not present

## 2017-11-24 DIAGNOSIS — L720 Epidermal cyst: Secondary | ICD-10-CM | POA: Diagnosis not present

## 2017-12-09 ENCOUNTER — Encounter: Payer: Self-pay | Admitting: Family Medicine

## 2017-12-09 MED ORDER — AZELASTINE-FLUTICASONE 137-50 MCG/ACT NA SUSP: NASAL | 3 refills | Status: DC

## 2017-12-10 ENCOUNTER — Encounter: Payer: Self-pay | Admitting: Family Medicine

## 2017-12-14 DIAGNOSIS — F429 Obsessive-compulsive disorder, unspecified: Secondary | ICD-10-CM | POA: Diagnosis not present

## 2017-12-14 DIAGNOSIS — F410 Panic disorder [episodic paroxysmal anxiety] without agoraphobia: Secondary | ICD-10-CM | POA: Diagnosis not present

## 2017-12-14 DIAGNOSIS — F40243 Fear of flying: Secondary | ICD-10-CM | POA: Diagnosis not present

## 2017-12-14 DIAGNOSIS — F411 Generalized anxiety disorder: Secondary | ICD-10-CM | POA: Diagnosis not present

## 2017-12-16 ENCOUNTER — Encounter: Payer: Self-pay | Admitting: Family Medicine

## 2017-12-16 ENCOUNTER — Ambulatory Visit: Payer: BLUE CROSS/BLUE SHIELD | Admitting: Family Medicine

## 2017-12-16 VITALS — BP 132/78 | HR 70 | Temp 98.9°F | Ht 67.5 in | Wt 180.6 lb

## 2017-12-16 DIAGNOSIS — R5383 Other fatigue: Secondary | ICD-10-CM | POA: Diagnosis not present

## 2017-12-16 DIAGNOSIS — I10 Essential (primary) hypertension: Secondary | ICD-10-CM

## 2017-12-16 DIAGNOSIS — R6882 Decreased libido: Secondary | ICD-10-CM

## 2017-12-16 DIAGNOSIS — J301 Allergic rhinitis due to pollen: Secondary | ICD-10-CM

## 2017-12-16 DIAGNOSIS — F3342 Major depressive disorder, recurrent, in full remission: Secondary | ICD-10-CM | POA: Diagnosis not present

## 2017-12-16 DIAGNOSIS — J309 Allergic rhinitis, unspecified: Secondary | ICD-10-CM | POA: Insufficient documentation

## 2017-12-16 DIAGNOSIS — D72819 Decreased white blood cell count, unspecified: Secondary | ICD-10-CM | POA: Diagnosis not present

## 2017-12-16 LAB — CBC WITH DIFFERENTIAL/PLATELET
BASOS PCT: 0.6 % (ref 0.0–3.0)
Basophils Absolute: 0 10*3/uL (ref 0.0–0.1)
EOS PCT: 1.6 % (ref 0.0–5.0)
Eosinophils Absolute: 0.1 10*3/uL (ref 0.0–0.7)
HCT: 41.2 % (ref 39.0–52.0)
HEMOGLOBIN: 14.2 g/dL (ref 13.0–17.0)
LYMPHS ABS: 1.3 10*3/uL (ref 0.7–4.0)
Lymphocytes Relative: 37.4 % (ref 12.0–46.0)
MCHC: 34.4 g/dL (ref 30.0–36.0)
MCV: 94.5 fl (ref 78.0–100.0)
MONO ABS: 0.2 10*3/uL (ref 0.1–1.0)
Monocytes Relative: 6.8 % (ref 3.0–12.0)
NEUTROS PCT: 53.6 % (ref 43.0–77.0)
Neutro Abs: 1.8 10*3/uL (ref 1.4–7.7)
Platelets: 313 10*3/uL (ref 150.0–400.0)
RBC: 4.36 Mil/uL (ref 4.22–5.81)
RDW: 12.4 % (ref 11.5–15.5)
WBC: 3.4 10*3/uL — ABNORMAL LOW (ref 4.0–10.5)

## 2017-12-16 LAB — COMPREHENSIVE METABOLIC PANEL
ALK PHOS: 47 U/L (ref 39–117)
ALT: 14 U/L (ref 0–53)
AST: 19 U/L (ref 0–37)
Albumin: 4.6 g/dL (ref 3.5–5.2)
BUN: 11 mg/dL (ref 6–23)
CO2: 31 mEq/L (ref 19–32)
Calcium: 9.9 mg/dL (ref 8.4–10.5)
Chloride: 100 mEq/L (ref 96–112)
Creatinine, Ser: 1.01 mg/dL (ref 0.40–1.50)
GFR: 86.17 mL/min (ref >60.00)
GLUCOSE: 102 mg/dL — AB (ref 70–99)
POTASSIUM: 4.3 meq/L (ref 3.5–5.1)
Sodium: 138 mEq/L (ref 135–145)
Total Bilirubin: 0.6 mg/dL (ref 0.2–1.2)
Total Protein: 7.3 g/dL (ref 6.0–8.3)

## 2017-12-16 LAB — TESTOSTERONE: TESTOSTERONE: 428.35 ng/dL (ref 300.00–890.00)

## 2017-12-16 LAB — TSH: TSH: 1.53 u[IU]/mL (ref 0.35–4.50)

## 2017-12-16 NOTE — Assessment & Plan Note (Signed)
S: controlled on  losartan hct 100-12.5mg . Home #s have looked good as well - normally 130s/70s to 80s. Walking an hour a day 5 days a week and lifting 2x a day- home workups BP Readings from Last 3 Encounters:  12/16/17 132/78  06/17/17 132/88  02/11/17 128/84  A/P: blood pressure goal of <140/90. Continue current meds

## 2017-12-16 NOTE — Progress Notes (Signed)
Subjective:  Jose Berry is a 42 y.o. year old very pleasant male patient who presents for/with See problem oriented charting ROS- seasonal allergies improved. Anxiety and depression controlle.d no chest pain. No edema.    Past Medical History-  Patient Active Problem List   Diagnosis Date Noted  . Family history of colonic polyps 02/11/2017    Priority: High  . GAD (generalized anxiety disorder)     Priority: High  . Leukopenia 12/16/2017    Priority: Medium  . Allergic rhinitis 12/16/2017    Priority: Medium  . Low libido 12/16/2017    Priority: Medium  . IBS (irritable bowel syndrome) 12/15/2016    Priority: Medium  . Depression     Priority: Medium  . Hypertension     Priority: Medium  . 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 Nerve pain 12/15/2016    Priority: Low    Medications- reviewed and updated Current Outpatient Medications  Medication Sig Dispense Refill  . ALPRAZolam (XANAX) 0.5 MG tablet Take 0.5 mg by mouth as needed. Takes prior to flights    . Azelastine-Fluticasone (DYMISTA) 137-50 MCG/ACT SUSP Use 1 spray each nostril twice a day 23 g 3  . buPROPion (WELLBUTRIN XL) 150 MG 24 hr tablet Take 150 mg by mouth daily.    . clonazePAM (KLONOPIN) 0.5 MG tablet Take 1 tablet in AM, half tablet in afternoon, 1 tablet in PM (Patient taking differently: Take 1/2 tablet in AM and 1/2 tablet in PM) 4 tablet 0  . desvenlafaxine (PRISTIQ) 100 MG 24 hr tablet Take 100 mg by mouth daily.    Marland Kitchen gabapentin (NEURONTIN) 300 MG capsule Take 1 capsule (300 mg total) by mouth 2 (two) times daily. (Patient taking differently: Take 300 mg by mouth 3 (three) times daily. 1 in the AM, ! In the afternoon, 2 in the PM) 90 capsule 3  . hydrOXYzine (ATARAX/VISTARIL) 10 MG tablet Take 1 tablet (10 mg total) by mouth 3 (three) times daily as needed. 30 tablet 0  . losartan-hydrochlorothiazide (HYZAAR) 100-12.5 MG tablet Take 1 tablet by mouth daily. 90 tablet 3  . sildenafil  (REVATIO) 20 MG tablet Take 2-5 tablets (40-100 mg total) by mouth daily as needed. 100 tablet 3  . sildenafil (VIAGRA) 100 MG tablet Take 1 tablet (100 mg total) daily as needed by mouth for erectile dysfunction. 10 tablet 5  . traZODone (DESYREL) 50 MG tablet Take 50 mg by mouth at bedtime as needed for sleep.     No current facility-administered medications for this visit.     Objective: BP 132/78 (BP Location: Left Arm, Patient Position: Sitting, Cuff Size: Large)   Pulse 70   Temp 98.9 F (37.2 C) (Oral)   Ht 5' 7.5" (1.715 m)   Wt 180 lb 9.6 oz (81.9 kg)   SpO2 98%   BMI 27.87 kg/m  Gen: NAD, resting comfortably CV: RRR no murmurs rubs or gallops Lungs: CTAB no crackles, wheeze, rhonchi Ext: no edema Skin: warm, dry  Assessment/Plan:  Other notes 1. 189 last visit- he wanted to lose another 10 lbs. Has lost 9 lbs! Doing a GREAT job with exercise 2. Left ankle sprain in past- still bothers him. Wants to get back to higher athletic level- was athletic when younger. Could refer to Dr. Paulla Fore if needed 3. Referred to skin surgery center for evaluation of cyst on side of nose- he has decided to leave this alone as it was benign. Had another lesion  removed from nose which was benign luckily 4. central serous retinopathy- affected vision. Stress or steroid use can cause- we will seek to avoid both if able other than topical steroids for allergies  Depression S: Depression reasonably controlled with phq9 of 4. Anxiety reasonably controlled. Following at [place] and on pristiq and wellbutrin with trazodone for sleep- was on wean from klonopin last visit after being on 15 years - has weaned almost 70%. Twitching muscles have resolved with time as coming off klonopin. Also on atarax to help with anxiety.   Exercise has helped as well.  A/P: continue current medicine  Leukopenia S:  mild leukopenia at last visit- persisted on follow up repeat within 2 months. HIV has been negative.  Doesn't recall this in the past.  Lab Results  Component Value Date   WBC 3.6 (L) 10/26/2017   HGB 13.6 10/26/2017   HCT 38.3 (L) 10/26/2017   MCV 91.0 10/26/2017   PLT 322 10/26/2017  A/P: will update CBC with diff and pathologist smear review. wellbutrin has been associated with leukopenia so we will check  Hypertension S: controlled on  losartan hct 100-12.5mg . Home #s have looked good as well - normally 130s/70s to 80s. Walking an hour a day 5 days a week and lifting 2x a day- home workups BP Readings from Last 3 Encounters:  12/16/17 132/78  06/17/17 132/88  02/11/17 128/84  A/P: blood pressure goal of <140/90. Continue current meds  Allergic rhinitis S: history of allergies. HE messaged me about a week ago asking about starting dymista which has helped him in the past. He stopped the nasonex. Helping well A/P: continue dymista  Low libido S: patient mentions low libido, gaining weight around belly even with exercise and strength training. He is concerned about his testosterone. Feels like somewhat low energy. Trouble maintaining muscle mass. States he has not been getting morning erections A/P: check testosterone today   Return in about 6 months (around 06/17/2018) for physical.  Lab/Order associations: Leukopenia, unspecified type - Plan: CBC w/Diff, Pathologist smear review  Low libido - Plan: Testosterone  Recurrent major depressive disorder, in full remission ([place])  Fatigue, unspecified type - Plan: Comprehensive metabolic panel, TSH  Return precautions advised.  Garret Reddish, MD

## 2017-12-16 NOTE — Assessment & Plan Note (Signed)
S:  mild leukopenia at last visit- persisted on follow up repeat within 2 months. HIV has been negative. Doesn't recall this in the past.  Lab Results  Component Value Date   WBC 3.6 (L) 10/26/2017   HGB 13.6 10/26/2017   HCT 38.3 (L) 10/26/2017   MCV 91.0 10/26/2017   PLT 322 10/26/2017  A/P: will update CBC with diff and pathologist smear review. wellbutrin has been associated with leukopenia so we will check

## 2017-12-16 NOTE — Assessment & Plan Note (Signed)
S: patient mentions low libido, gaining weight around belly even with exercise and strength training. He is concerned about his testosterone. Feels like somewhat low energy. Trouble maintaining muscle mass. States he has not been getting morning erections A/P: check testosterone today

## 2017-12-16 NOTE — Patient Instructions (Signed)
Please stop by lab before you go  No changes today  May need to have you back for more bloodwork if testosterone is low

## 2017-12-16 NOTE — Assessment & Plan Note (Signed)
S: history of allergies. HE messaged me about a week ago asking about starting dymista which has helped him in the past. He stopped the nasonex. Helping well A/P: continue dymista

## 2017-12-16 NOTE — Assessment & Plan Note (Signed)
S: Depression reasonably controlled with phq9 of 4. Anxiety reasonably controlled. Following at [facility] and on pristiq and wellbutrin with trazodone for sleep- was on wean from klonopin last visit after being on 15 years - has weaned almost 70%. Twitching muscles have resolved with time as coming off klonopin. Also on atarax to help with anxiety.   Exercise has helped as well.  A/P: continue current medicine

## 2017-12-17 ENCOUNTER — Telehealth: Payer: Self-pay

## 2017-12-17 ENCOUNTER — Encounter: Payer: Self-pay | Admitting: Family Medicine

## 2017-12-17 DIAGNOSIS — N951 Menopausal and female climacteric states: Secondary | ICD-10-CM | POA: Diagnosis not present

## 2017-12-17 LAB — PATHOLOGIST SMEAR REVIEW

## 2017-12-17 NOTE — Telephone Encounter (Signed)
I called and spoke to patient and informed him about his labs results.

## 2017-12-17 NOTE — Telephone Encounter (Signed)
Patient also sent another my chart message with following question:  I have a question about COMPREHENSIVE METABOLIC PANEL resulted on 12/16/17, 2:48 PM.    Sorry, one other question. I have a form for work that I need to send to you guys about these tests and metabolic syndrome. My understanding is that there are 5 components to metabolic syndrome, and my only one would be the slightly high glucose. How should ai get you this form, and also, will the 102 glucose dx me for metabolic syndrome?

## 2017-12-23 DIAGNOSIS — R6882 Decreased libido: Secondary | ICD-10-CM | POA: Diagnosis not present

## 2017-12-23 DIAGNOSIS — R5383 Other fatigue: Secondary | ICD-10-CM | POA: Diagnosis not present

## 2017-12-23 DIAGNOSIS — G479 Sleep disorder, unspecified: Secondary | ICD-10-CM | POA: Diagnosis not present

## 2017-12-24 ENCOUNTER — Encounter: Payer: Self-pay | Admitting: Family Medicine

## 2017-12-28 ENCOUNTER — Encounter: Payer: Self-pay | Admitting: Family Medicine

## 2018-01-12 ENCOUNTER — Encounter: Payer: Self-pay | Admitting: Family Medicine

## 2018-01-20 ENCOUNTER — Encounter: Payer: Self-pay | Admitting: Family Medicine

## 2018-01-20 DIAGNOSIS — G479 Sleep disorder, unspecified: Secondary | ICD-10-CM | POA: Diagnosis not present

## 2018-01-20 DIAGNOSIS — R6882 Decreased libido: Secondary | ICD-10-CM | POA: Diagnosis not present

## 2018-01-20 DIAGNOSIS — R5383 Other fatigue: Secondary | ICD-10-CM | POA: Diagnosis not present

## 2018-01-21 DIAGNOSIS — E291 Testicular hypofunction: Secondary | ICD-10-CM | POA: Diagnosis not present

## 2018-01-21 DIAGNOSIS — R5383 Other fatigue: Secondary | ICD-10-CM | POA: Diagnosis not present

## 2018-01-21 DIAGNOSIS — G479 Sleep disorder, unspecified: Secondary | ICD-10-CM | POA: Diagnosis not present

## 2018-01-21 DIAGNOSIS — R6882 Decreased libido: Secondary | ICD-10-CM | POA: Diagnosis not present

## 2018-02-17 ENCOUNTER — Other Ambulatory Visit: Payer: Self-pay | Admitting: Family Medicine

## 2018-04-26 ENCOUNTER — Encounter: Payer: Self-pay | Admitting: Family Medicine

## 2018-05-28 ENCOUNTER — Encounter: Payer: Self-pay | Admitting: Family Medicine

## 2018-05-28 MED ORDER — LOSARTAN POTASSIUM-HCTZ 100-12.5 MG PO TABS: 1.0000 | ORAL_TABLET | Freq: Every day | ORAL | 1 refills | Status: DC

## 2018-06-17 ENCOUNTER — Encounter: Payer: BLUE CROSS/BLUE SHIELD | Admitting: Family Medicine

## 2018-07-14 ENCOUNTER — Encounter: Payer: Self-pay | Admitting: Family Medicine

## 2018-07-30 ENCOUNTER — Encounter: Payer: BLUE CROSS/BLUE SHIELD | Admitting: Family Medicine

## 2018-10-04 ENCOUNTER — Ambulatory Visit (INDEPENDENT_AMBULATORY_CARE_PROVIDER_SITE_OTHER): Payer: No Typology Code available for payment source | Admitting: Family Medicine

## 2018-10-04 ENCOUNTER — Encounter

## 2018-10-04 ENCOUNTER — Encounter: Payer: Self-pay | Admitting: Family Medicine

## 2018-10-04 VITALS — BP 128/86 | HR 72 | Temp 97.7°F | Ht 67.5 in | Wt 171.2 lb

## 2018-10-04 DIAGNOSIS — Z23 Encounter for immunization: Secondary | ICD-10-CM

## 2018-10-04 DIAGNOSIS — K921 Melena: Secondary | ICD-10-CM

## 2018-10-04 DIAGNOSIS — I10 Essential (primary) hypertension: Secondary | ICD-10-CM

## 2018-10-04 DIAGNOSIS — Z0000 Encounter for general adult medical examination without abnormal findings: Secondary | ICD-10-CM

## 2018-10-04 MED ORDER — LOSARTAN POTASSIUM-HCTZ 100-12.5 MG PO TABS: 1.0000 | ORAL_TABLET | Freq: Every day | ORAL | 3 refills | Status: DC

## 2018-10-04 MED ORDER — AZELASTINE-FLUTICASONE 137-50 MCG/ACT NA SUSP: NASAL | 3 refills | Status: DC

## 2018-10-04 NOTE — Patient Instructions (Addendum)
Please stop by lab before you go  No changes today- glad you are doing well!   Congrats on all your hard work! 18 lbs since 06/17/17.

## 2018-10-04 NOTE — Progress Notes (Signed)
Phone: [phone]  Subjective:  Patient presents today for their annual physical. Chief complaint-noted.   See problem oriented charting- ROS- full  review of systems was completed and negative except for: still deals with anxiety but doing much better.   The following were reviewed and entered/updated in epic: Past Medical History:  Diagnosis Date  . Allergy    nasal lavave 3x a day. in past multiple meds- claritin, singulair, nasal spray.   . Depression    wellbutrin '150mg'$  XR, pristiq '100mg'$ . ambien '10mg'$  for sleep. clonazepam 1 tablet TID prn  . GAD (generalized anxiety disorder)    wellbutrin '150mg'$  XR, pristiq '100mg'$ . ambien '10mg'$  for sleep. clonazepam 1 tablet TID prn  . History of Clostridium difficile infection   . Hypertension    losartan HCT 100-12.'5mg'$  daily   Patient Active Problem List   Diagnosis Date Noted  . Family history of colonic polyps 02/11/2017    Priority: High  . GAD (generalized anxiety disorder)     Priority: High  . Leukopenia 12/16/2017    Priority: Medium  . Allergic rhinitis 12/16/2017    Priority: Medium  . Low libido 12/16/2017    Priority: Medium  . IBS (irritable bowel syndrome) 12/15/2016    Priority: Medium  . Depression     Priority: Medium  . Hypertension     Priority: Medium  . 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 Nerve pain 12/15/2016    Priority: Low   Past Surgical History:  Procedure Laterality Date  . FUNCTIONAL ENDOSCOPIC SINUS SURGERY  2003  . NASAL SINUS SURGERY  2010   baloonoplasty  . VASECTOMY      Family History  Problem Relation Age of Onset  . Other Mother        smoldering myeloma. precursor to multiple myeloma.   . Hypertension Father   . Anxiety disorder Father   . Colon polyps Father        tubular adenoma  . Lupus Sister     Medications- reviewed and updated Current Outpatient Medications  Medication Sig Dispense Refill  . ALPRAZolam (XANAX) 0.5 MG tablet Take 0.5 mg by mouth as  needed. Takes prior to flights    . anastrozole (ARIMIDEX) 1 MG tablet Take 1 mg by mouth once a week. Through [facility] clinic    . Azelastine-Fluticasone (DYMISTA) 137-50 MCG/ACT SUSP Use 1 spray each nostril twice a day 69 g 3  . buPROPion (WELLBUTRIN XL) 150 MG 24 hr tablet Take 150 mg by mouth daily.    . clonazePAM (KLONOPIN) 0.5 MG tablet Take 1 tablet in AM, half tablet in afternoon, 1 tablet in PM (Patient taking differently: Take 1/2 tablet in AM and 1/2 tablet in PM) 4 tablet 0  . desvenlafaxine (PRISTIQ) 100 MG 24 hr tablet Take 100 mg by mouth daily.    Marland Kitchen gabapentin (NEURONTIN) 300 MG capsule Take 1 capsule (300 mg total) by mouth 2 (two) times daily. (Patient taking differently: Take 300 mg by mouth 3 (three) times daily. 1 in the AM, ! In the afternoon, 2 in the PM) 90 capsule 3  . losartan-hydrochlorothiazide (HYZAAR) 100-12.5 MG tablet Take 1 tablet by mouth daily. 90 tablet 3  . testosterone cypionate (DEPOTESTOTERONE CYPIONATE) 100 MG/ML injection Inject into the muscle every 7 (seven) days. Through [facility] clinic    . traZODone (DESYREL) 50 MG tablet Take 25 mg by mouth at bedtime as needed for sleep.      No current facility-administered medications  for this visit.     Allergies-reviewed and updated Allergies  Allergen Reactions  . Doxycycline Nausea And Vomiting    Social History   Social History Narrative   Married 2002. 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  Oropharynx normal Neck: no thyromegaly CV: RRR no murmurs rubs or gallops Lungs: CTAB no crackles, wheeze, rhonchi Abdomen: soft/nontender/nondistended/normal bowel sounds. No rebound or guarding.  Ext: no edema Skin: warm, dry Neuro: grossly normal, moves all extremities, PERRLA GU: external- no hemorrhoids or skin tags noted  Assessment/Plan:  43 y.o. male presenting for annual physical.  Health Maintenance counseling: 1. Anticipatory guidance: Patient counseled regarding regular dental exams -q6 months, eye exams - no recent issues,  avoiding smoking and second hand smoke , limiting alcohol to 2 beverages per day- none right now.   2. Risk factor reduction:  Advised patient of need for regular exercise and diet rich and fruits and vegetables to reduce risk of heart attack and stroke. Exercise- has been walking regularly- gets out during lunch 4 days a week and sometimes on weekends getting 1-2 mile walk, also lifts weights about 4 days a week. Diet-has limited sugar, cut down on alcohol.  Weight management-down another 9 pounds from last visit-congratulated him on effort-this is now down 18 pounds over about a 8-month Wt Readings from Last 3 Encounters:  10/04/18 171 lb 3.2 oz (77.7 kg)  12/16/17 180 lb 9.6 oz (81.9 kg)  06/17/17 189 lb 6.4 oz (85.9 kg)  3. Immunizations/screenings/ancillary studies-up-to-date  Immunization History  Administered Date(s) Administered  . Influenza,inj,Quad PF,6+ Mos 06/17/2017, 10/04/2018  . Influenza-Unspecified 07/30/2016  . Tdap 02/11/2017  4. Prostate cancer screening- getting PSA checked due to testosterone therapy (he states around 0.2-0.4)   5. Colon cancer screening - dad with adenoma at 55-we previously planned to start colon cancer screening at 491 1-2x last week- noted bright red blood on toilet and slight blood around stool. With family history he would like to go ahead and see GI. Offered stool cards and refer if positive but he prefers to  go ahead and consult GI 6. Skin cancer screening/prevention- seeing dermatology once a year- had one biopsy site which was benign from nose. advised regular sunscreen use- 50 SPF. Denies worrisome, changing, or new skin lesions.  7. Testicular cancer screening- advised monthly self exams  8. STD screening- patient opts out- monogamous 9.  Never smoker  Status of chronic or acute concerns   [facility]. Also on daily clonazepam (down to half from original dose). For flights- sparing xanax. [facility] also now has him on gabpentin  Leukopenia-has had some mild leukopenia in the past.  HIV negative.  CBC largely reassuring back last April.  Wellbutrin could be associated  Hypertension-controlled on losartan hydrochlorothiazide 100-12.5 mg.  Home numbers reasonably controlled  usually 120s to 130s over 80s (rarely to 90   Allergies-doing well on dymista-continue current medication- doing well  hasnt needed sildenafil since being on testosterone treatment. Testosterone on 0.4 mL one a week in his thigh. Also on anastrozole 1 mg once a week  Lab/Order associations: coffee with splenda and cream- this AM 8 15   Preventative health care - Plan: Comprehensive metabolic panel, Lipid panel, CBC with Differential/Platelet  Need for prophylactic vaccination and inoculation against influenza - Plan: Flu Vaccine QUAD 36+ mos IM  Blood in stool - Plan: Ambulatory referral to Gastroenterology  Essential hypertension - Plan: Comprehensive metabolic panel, Lipid panel, CBC with Differential/Platelet  Meds ordered this encounter  Medications  . losartan-hydrochlorothiazide (HYZAAR) 100-12.5 MG tablet    Sig: Take 1 tablet by mouth daily.    Dispense:  90 tablet    Refill:  3  . Azelastine-Fluticasone (DYMISTA) 137-50 MCG/ACT SUSP    Sig: Use 1 spray each nostril twice a day    Dispense:  69 g    Refill:  3   Return precautions advised.  Garret Reddish, MD

## 2018-10-05 LAB — COMPREHENSIVE METABOLIC PANEL
ALBUMIN: 4.7 g/dL (ref 3.5–5.2)
ALT: 10 U/L (ref 0–53)
AST: 19 U/L (ref 0–37)
Alkaline Phosphatase: 45 U/L (ref 39–117)
BUN: 11 mg/dL (ref 6–23)
CHLORIDE: 98 meq/L (ref 96–112)
CO2: 33 mEq/L — ABNORMAL HIGH (ref 19–32)
CREATININE: 1.06 mg/dL (ref 0.40–1.50)
Calcium: 9.9 mg/dL (ref 8.4–10.5)
GFR: 76.38 mL/min (ref >60.00)
Glucose, Bld: 86 mg/dL (ref 70–99)
Potassium: 4.1 mEq/L (ref 3.5–5.1)
SODIUM: 138 meq/L (ref 135–145)
Total Bilirubin: 0.5 mg/dL (ref 0.2–1.2)
Total Protein: 7.1 g/dL (ref 6.0–8.3)

## 2018-10-05 LAB — LIPID PANEL
CHOL/HDL RATIO: 4
Cholesterol: 251 mg/dL — ABNORMAL HIGH (ref 0–200)
HDL: 71.4 mg/dL (ref >39.00)
LDL CALC: 158 mg/dL — AB (ref 0–99)
NonHDL: 179.34
Triglycerides: 107 mg/dL (ref 0.0–149.0)
VLDL: 21.4 mg/dL (ref 0.0–40.0)

## 2018-10-05 LAB — CBC WITH DIFFERENTIAL/PLATELET
BASOS ABS: 0 10*3/uL (ref 0.0–0.1)
Basophils Relative: 1.3 % (ref 0.0–3.0)
EOS ABS: 0.1 10*3/uL (ref 0.0–0.7)
Eosinophils Relative: 3.4 % (ref 0.0–5.0)
HEMATOCRIT: 42.4 % (ref 39.0–52.0)
Hemoglobin: 14.6 g/dL (ref 13.0–17.0)
LYMPHS PCT: 50.6 % — AB (ref 12.0–46.0)
Lymphs Abs: 1.7 10*3/uL (ref 0.7–4.0)
MCHC: 34.5 g/dL (ref 30.0–36.0)
MCV: 95.1 fl (ref 78.0–100.0)
Monocytes Absolute: 0.3 10*3/uL (ref 0.1–1.0)
Monocytes Relative: 8.2 % (ref 3.0–12.0)
NEUTROS ABS: 1.2 10*3/uL — AB (ref 1.4–7.7)
Neutrophils Relative %: 36.5 % — ABNORMAL LOW (ref 43.0–77.0)
PLATELETS: 293 10*3/uL (ref 150.0–400.0)
RBC: 4.45 Mil/uL (ref 4.22–5.81)
RDW: 12.1 % (ref 11.5–15.5)
WBC: 3.4 10*3/uL — ABNORMAL LOW (ref 4.0–10.5)

## 2018-10-06 ENCOUNTER — Encounter: Payer: Self-pay | Admitting: Gastroenterology

## 2018-10-27 ENCOUNTER — Ambulatory Visit: Payer: No Typology Code available for payment source | Admitting: Gastroenterology

## 2018-10-27 ENCOUNTER — Encounter: Payer: Self-pay | Admitting: Gastroenterology

## 2018-10-27 VITALS — BP 114/80 | HR 72 | Ht 68.0 in | Wt 174.0 lb

## 2018-10-27 DIAGNOSIS — K625 Hemorrhage of anus and rectum: Secondary | ICD-10-CM

## 2018-10-27 MED ORDER — NA SULFATE-K SULFATE-MG SULF 17.5-3.13-1.6 GM/177ML PO SOLN: 1.0000 | Freq: Once | ORAL | 0 refills | Status: AC

## 2018-10-27 NOTE — Progress Notes (Signed)
[facility] Consult Note:  History: Jose Berry 10/27/2018  Referring physician: Marin Olp, MD  Reason for consult/chief complaint: Hematochezia (3 weeks ago had a BM with blood in the toilet. did have some straining but denies constipation) and family history of colon polyps (Father had a tubulovillous colon polyp.)   Subjective  HPI:  This is a very pleasant 43 year old man referred by primary care for a recent episode of rectal bleeding. I reviewed the most recent primary care note, mentioning that the patient's father had a tubulovillous adenomatous polyp in his mid 68s.  This apparently led Dr. Yong Channel to initially recommend a screening colonoscopy for this patient at age 47.  However, he recently had an episode of rectal bleeding and was referred to [place].  He had bleeding about 3 weeks ago during a period of constipation.  His bowels are generally regular.  He denies chronic abdominal pain, weight loss, dysphagia, odynophagia, nausea or vomiting.  ROS:  Review of Systems  He denies chest pain dyspnea or dysuria Chronic anxiety Allergy symptoms  Past Medical History: Past Medical History:  Diagnosis Date  . Allergy    nasal lavave 3x a day. in past multiple meds- claritin, singulair, nasal spray.   . Depression    wellbutrin 128m XR, pristiq 1066m ambien 1062mor sleep. clonazepam 1 tablet TID prn  . Fear of flying   . GAD (generalized anxiety disorder)    wellbutrin 150m27m, pristiq 100mg56mbien 10mg 27msleep. clonazepam 1 tablet TID prn  . History of Clostridium difficile infection   . Hypertension    losartan HCT 100-12.5mg da3m  . Leukopenia   . Low libido   . OCD (obsessive compulsive disorder)   . Panic disorder      Past Surgical History: Past Surgical History:  Procedure Laterality Date  . FUNCTIONAL ENDOSCOPIC SINUS SURGERY  2003  . NASAL SINUS SURGERY  2010   baloonoplasty  . VASECTOMY       Family  History: Family History  Problem Relation Age of Onset  . Other Mother        smoldering myeloma. precursor to multiple myeloma.   . Multiple myeloma Mother   . Hypertension Father   . Anxiety disorder Father   . Colon polyps Father        tubularvillous adenoma  . Lupus Sister   . Stomach cancer Neg Hx   . Pancreatic cancer Neg Hx     Social History: Social History   Socioeconomic History  . Marital status: Married    Spouse name: Not on file  . Number of children: Not on file  . Years of education: Not on file  . Highest education level: Not on file  Occupational History  . Not on file  Social Needs  . Financial resource strain: Not on file  . Food insecurity:    Worry: Not on file    Inability: Not on file  . Transportation needs:    Medical: Not on file    Non-medical: Not on file  Tobacco Use  . Smoking status: Never Smoker  . Smokeless tobacco: Never Used  Substance and Sexual Activity  . Alcohol use: Yes    Alcohol/week: 1.0 standard drinks    Types: 1 Standard drinks or equivalent per week    Comment: Occ x 1 a month  . Drug use: No  . Sexual activity: Yes    Partners: Female  Lifestyle  . Physical activity:  Days per week: Not on file    Minutes per session: Not on file  . Stress: Not on file  Relationships  . Social connections:    Talks on phone: Not on file    Gets together: Not on file    Attends religious service: Not on file    Active member of club or organization: Not on file    Attends meetings of clubs or organizations: Not on file    Relationship status: Not on file  Other Topics Concern  . Not on file  Social History Narrative   Married 2002. 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 Doxycycline Nausea And Vomiting    Outpatient Meds: Current Outpatient Medications  Medication Sig Dispense Refill  . ALPRAZolam (XANAX) 0.5 MG tablet Take 0.5 mg by mouth as needed. Takes prior to flights    . anastrozole (ARIMIDEX) 1 MG tablet Take 1 mg by mouth once a week. Through [facility] clinic    . Azelastine-Fluticasone (DYMISTA) 137-50 MCG/ACT SUSP Use 1 spray each nostril twice a day 69 g 3  . buPROPion (WELLBUTRIN XL) 150 MG 24 hr tablet Take 150 mg by mouth daily.    . clonazePAM (KLONOPIN) 0.5 MG tablet Take 1 tablet in AM, half tablet in afternoon, 1 tablet in PM (Patient taking differently: Take 1/2 tablet in AM and 1/2 tablet in PM) 4 tablet 0  . desvenlafaxine (PRISTIQ) 100 MG 24 hr tablet Take 100 mg by mouth daily.    Marland Kitchen gabapentin (NEURONTIN) 300 MG capsule Take 1 capsule (300 mg total) by mouth 2 (two) times daily. (Patient taking differently: Take 300 mg by mouth 3 (three) times daily. 1 in the AM, ! In the afternoon, 2 in the PM) 90 capsule 3  . losartan-hydrochlorothiazide (HYZAAR) 100-12.5 MG tablet Take 1 tablet by mouth daily. 90 tablet 3  . testosterone cypionate (DEPOTESTOTERONE CYPIONATE) 100 MG/ML injection Inject into the muscle every 7 (seven) days. Through [facility] clinic    . traZODone (DESYREL) 50 MG tablet Take 25 mg by mouth at bedtime as needed for sleep.     . Na Sulfate-K Sulfate-Mg Sulf 17.5-3.13-1.6 GM/177ML SOLN Take 1 kit by mouth once for 1 dose. 354 mL 0   No current facility-administered medications for this visit.       ___________________________________________________________________ Objective   Exam:  BP 114/80   Pulse 72   Ht _0  (1.727 m)   Wt 174 lb (78.9 kg)   BMI 26.46 kg/m    General: Well-appearing  Eyes: sclera anicteric, no redness  ENT: oral mucosa moist without lesions, no cervical or supraclavicular lymphadenopathy  CV: RRR without murmur, S1/S2, no JVD, no peripheral edema  Resp: clear  to auscultation bilaterally, normal RR and effort noted  GI: soft, no tenderness, with active bowel sounds. No guarding or palpable organomegaly noted.  Skin; warm and dry, no rash or jaundice noted  Neuro: awake, alert and oriented x 3. Normal gross motor function and fluent speech Rectal: Normal external, normal sphincter tone and nontender.  Slightly irregular anterior rectal wall mucosa.  Soft Sterba stool, heme-negative  Labs:  CBC Latest Ref Rng & Units  10/04/2018 12/16/2017 10/26/2017  WBC 4.0 - 10.5 K/uL 3.4(L) 3.4(L) 3.6(L)  Hemoglobin 13.0 - 17.0 g/dL 14.6 14.2 13.6  Hematocrit 39.0 - 52.0 % 42.4 41.2 38.3(L)  Platelets 150.0 - 400.0 K/uL 293.0 313.0 322     Assessment: Encounter Diagnosis  Name Primary?  . Rectal bleeding Yes    This was one episode recently that occurred in the setting of constipation. Even if his father's polyp was over 10 mm and therefore considered an advanced adenoma, it is not clear whether Kaedan is at much increased risk and should be screened earlier than usual age of 27.  However, the bleeding should be investigated.  I believe that if there are no significant findings, it will also be very reassuring to him.  Plan:  Colonoscopy.  He is agreeable after a discussion of procedure and risks.  The benefits and risks of the planned procedure were described in detail with the patient or (when appropriate) their health care proxy.  Risks were outlined as including, but not limited to, bleeding, infection, perforation, adverse medication reaction leading to cardiac or pulmonary decompensation, or pancreatitis (if ERCP).  The limitation of incomplete mucosal visualization was also discussed.  No guarantees or warranties were given.   Thank you for the courtesy of this consult.  Please call me with any questions or concerns.  Nelida Meuse III  CC: Referring provider noted above

## 2018-10-27 NOTE — Patient Instructions (Signed)
If you are age 43 or older, your body mass index should be between 23-30. Your Body mass index is 26.46 kg/m. If this is out of the aforementioned range listed, please consider follow up with your Primary Care Provider.  If you are age 73 or younger, your body mass index should be between 19-25. Your Body mass index is 26.46 kg/m. If this is out of the aformentioned range listed, please consider follow up with your Primary Care Provider.   You have been scheduled for a colonoscopy. Please follow written instructions given to you at your visit today.  Please pick up your prep supplies at the pharmacy within the next 1-3 days. If you use inhalers (even only as needed), please bring them with you on the day of your procedure. Your physician has requested that you go to www.startemmi.com and enter the access code given to you at your visit today. This web site gives a general overview about your procedure. However, you should still follow specific instructions given to you by our office regarding your preparation for the procedure.  It was a pleasure to see you today!  Dr. Loletha Carrow

## 2018-11-17 ENCOUNTER — Ambulatory Visit (AMBULATORY_SURGERY_CENTER): Payer: No Typology Code available for payment source | Admitting: Gastroenterology

## 2018-11-17 ENCOUNTER — Encounter: Payer: Self-pay | Admitting: Gastroenterology

## 2018-11-17 ENCOUNTER — Other Ambulatory Visit: Payer: Self-pay

## 2018-11-17 VITALS — BP 113/78 | HR 70 | Temp 96.6°F | Resp 16

## 2018-11-17 DIAGNOSIS — D128 Benign neoplasm of rectum: Secondary | ICD-10-CM | POA: Diagnosis not present

## 2018-11-17 DIAGNOSIS — K6289 Other specified diseases of anus and rectum: Secondary | ICD-10-CM | POA: Diagnosis present

## 2018-11-17 DIAGNOSIS — K635 Polyp of colon: Secondary | ICD-10-CM

## 2018-11-17 DIAGNOSIS — K625 Hemorrhage of anus and rectum: Secondary | ICD-10-CM

## 2018-11-17 DIAGNOSIS — D122 Benign neoplasm of ascending colon: Secondary | ICD-10-CM

## 2018-11-17 MED ORDER — SODIUM CHLORIDE 0.9 % IV SOLN: 500.0000 mL | Freq: Once | INTRAVENOUS | Status: DC

## 2018-11-17 NOTE — Patient Instructions (Signed)
Please read handouts provided. Continue present medications. Await pathology results. Miralax powder, one half to full packet as needed for constipation.      YOU HAD AN ENDOSCOPIC PROCEDURE TODAY AT [facility]:   Refer to the procedure report that was given to you for any specific questions about what was found during the examination.  If the procedure report does not answer your questions, please call your gastroenterologist to clarify.  If you requested that your care partner not be given the details of your procedure findings, then the procedure report has been included in a sealed envelope for you to review at your convenience later.  YOU SHOULD EXPECT: Some feelings of bloating in the abdomen. Passage of more gas than usual.  Walking can help get rid of the air that was put into your GI tract during the procedure and reduce the bloating. If you had a lower endoscopy (such as a colonoscopy or flexible sigmoidoscopy) you may notice spotting of blood in your stool or on the toilet paper. If you underwent a bowel prep for your procedure, you may not have a normal bowel movement for a few days.  Please Note:  You might notice some irritation and congestion in your nose or some drainage.  This is from the oxygen used during your procedure.  There is no need for concern and it should clear up in a day or so.  SYMPTOMS TO REPORT IMMEDIATELY:   Following lower endoscopy (colonoscopy or flexible sigmoidoscopy):  Excessive amounts of blood in the stool  Significant tenderness or worsening of abdominal pains  Swelling of the abdomen that is new, acute  Fever of 100F or higher   For urgent or emergent issues, a gastroenterologist can be reached at any hour by calling [phone].   DIET:  We do recommend a small meal at first, but then you may proceed to your regular diet.  Drink plenty of fluids but you should avoid alcoholic beverages for 24 hours.  ACTIVITY:  You  should plan to take it easy for the rest of today and you should NOT DRIVE or use heavy machinery until tomorrow (because of the sedation medicines used during the test).    FOLLOW UP: Our staff will call the number listed on your records the next business day following your procedure to check on you and address any questions or concerns that you may have regarding the information given to you following your procedure. If we do not reach you, we will leave a message.  However, if you are feeling well and you are not experiencing any problems, there is no need to return our call.  We will assume that you have returned to your regular daily activities without incident.  If any biopsies were taken you will be contacted by phone or by letter within the next 1-3 weeks.  Please call us at [phone] if you have not heard about the biopsies in 3 weeks.    SIGNATURES/CONFIDENTIALITY: You and/or your care partner have signed paperwork which will be entered into your electronic medical record.  These signatures attest to the fact that that the information above on your After Visit Summary has been reviewed and is understood.  Full responsibility of the confidentiality of this discharge information lies with you and/or your care-partner.

## 2018-11-17 NOTE — Progress Notes (Signed)
Pt's states no medical or surgical changes since previsit or office visit. 

## 2018-11-17 NOTE — Progress Notes (Signed)
Alert and oriented x3, pleased with MAC, report to RN  

## 2018-11-17 NOTE — Progress Notes (Signed)
Called to room to assist during endoscopic procedure.  Patient ID and intended procedure confirmed with present staff. Received instructions for my participation in the procedure from the performing physician.  

## 2018-11-17 NOTE — Op Note (Signed)
[facility] Patient Name: Jose Berry Procedure Date: 11/17/2018 10:37 AM MRN: [id number] Endoscopist: Mallie Mussel L. Loletha Carrow , MD Age: 43 Referring MD:  Date of Birth: 1976/01/29 Gender: Male Account #: [id number] Procedure:                Colonoscopy Indications:              Rectal bleeding Medicines:                Monitored Anesthesia Care Procedure:                Pre-Anesthesia Assessment:                           - Prior to the procedure, a History and Physical                            was performed, and patient medications and                            allergies were reviewed. The patient's tolerance of                            previous anesthesia was also reviewed. The risks                            and benefits of the procedure and the sedation                            options and risks were discussed with the patient.                            All questions were answered, and informed consent                            was obtained. Prior Anticoagulants: The patient has                            taken no previous anticoagulant or antiplatelet                            agents. ASA Grade Assessment: II - A patient with                            mild systemic disease. After reviewing the risks                            and benefits, the patient was deemed in                            satisfactory condition to undergo the procedure.                           After obtaining informed consent, the colonoscope  was passed under direct vision. Throughout the                            procedure, the patient's blood pressure, pulse, and                            oxygen saturations were monitored continuously. The                            Colonoscope was introduced through the anus and                            advanced to the the cecum, identified by                            appendiceal orifice and ileocecal valve. The               colonoscopy was performed without difficulty. The                            patient tolerated the procedure well. The quality                            of the bowel preparation was initially fair, then                            improved to good with extensive lavage. The                            ileocecal valve, appendiceal orifice, and rectum                            were photographed. The bowel preparation used was                            SUPREP. Scope In: 10:45:50 AM Scope Out: 11:08:22 AM Scope Withdrawal Time: 0 hours 15 minutes 54 seconds  Total Procedure Duration: 0 hours 22 minutes 32 seconds  Findings:                 The perianal and digital rectal examinations were                            normal.                           An area of melanosis was found from transverse                            colon to cecum.                           A diminutive polyp was found in the ascending                            colon. The  polyp was sessile. The polyp was removed                            with a cold biopsy forceps. Resection and retrieval                            were complete.                           A 6 mm polyp was found in the rectum. The polyp was                            sessile. The polyp was removed with a hot snare.                            Resection and retrieval were complete.                           The exam was otherwise without abnormality on                            direct and retroflexion views. Complications:            No immediate complications. Estimated Blood Loss:     Estimated blood loss was minimal. Impression:               - Melanosis in the colon.                           - One diminutive polyp in the ascending colon,                            removed with a cold biopsy forceps. Resected and                            retrieved.                           - One 6 mm polyp in the rectum, removed with a hot                             snare. Resected and retrieved.                           - The examination was otherwise normal on direct                            and retroflexion views.                           Benign anal-rectal bleeding from episodic                            constipation. Recommendation:           - Patient has a contact number available for  emergencies. The signs and symptoms of potential                            delayed complications were discussed with the                            patient. Return to normal activities tomorrow.                            Written discharge instructions were provided to the                            patient.                           - Resume previous diet.                           - Continue present medications.                           - Await pathology results.                           - Repeat colonoscopy is recommended for                            surveillance. The colonoscopy date will be                            determined after pathology results from today's                            exam become available for review.                           - Miralax powder, one half to full packet as needed                            for constipation. Emir Nack L. Loletha Carrow, MD 11/17/2018 11:16:07 AM This report has been signed electronically.

## 2018-11-18 ENCOUNTER — Telehealth: Payer: Self-pay | Admitting: *Deleted

## 2018-11-18 NOTE — Telephone Encounter (Signed)
  Follow up Call-  Call back number 11/17/2018  Post procedure Call Back phone  # [phone]  Permission to leave phone message Yes  Some recent data might be hidden     Patient questions:  Message left to call us if necessary.

## 2018-11-18 NOTE — Telephone Encounter (Signed)
  Follow up Call-  Call back number 11/17/2018  Post procedure Call Back phone  # [phone]  Permission to leave phone message Yes  Some recent data might be hidden     Patient questions:  Do you have a fever, pain , or abdominal swelling? No. Pain Score  0 *  Have you tolerated food without any problems? Yes.    Have you been able to return to your normal activities? Yes.    Do you have any questions about your discharge instructions: Diet   No. Medications  No. Follow up visit  No.  Do you have questions or concerns about your Care? No.  Actions: * If pain score is 4 or above: No action needed, pain <4.

## 2018-11-23 ENCOUNTER — Encounter: Payer: Self-pay | Admitting: Gastroenterology

## 2018-11-24 ENCOUNTER — Encounter: Payer: Self-pay | Admitting: Family Medicine

## 2018-11-24 DIAGNOSIS — Z86010 Personal history of colonic polyps: Secondary | ICD-10-CM | POA: Insufficient documentation

## 2018-11-25 ENCOUNTER — Encounter: Payer: Self-pay | Admitting: Family Medicine

## 2018-11-25 ENCOUNTER — Other Ambulatory Visit: Payer: Self-pay

## 2018-11-25 MED ORDER — LOSARTAN POTASSIUM-HCTZ 100-25 MG PO TABS: 1.0000 | ORAL_TABLET | Freq: Every day | ORAL | 3 refills | Status: DC

## 2018-11-25 NOTE — Telephone Encounter (Signed)
Yes thanks- you may send in to [facility] on [address]- I sent one into [facility]- want him to have options at both. Please inform him on friday

## 2018-11-26 ENCOUNTER — Other Ambulatory Visit: Payer: Self-pay

## 2018-11-26 MED ORDER — LOSARTAN POTASSIUM-HCTZ 100-12.5 MG PO TABS: 1.0000 | ORAL_TABLET | Freq: Every day | ORAL | 3 refills | Status: DC

## 2018-11-26 NOTE — Telephone Encounter (Signed)
Rx was sent to [facility].  Pt aware.

## 2018-11-29 ENCOUNTER — Other Ambulatory Visit: Payer: Self-pay

## 2018-12-01 ENCOUNTER — Encounter: Payer: Self-pay | Admitting: Family Medicine

## 2018-12-01 NOTE — Telephone Encounter (Signed)
Please see message . Thank you .

## 2018-12-22 ENCOUNTER — Other Ambulatory Visit: Payer: Self-pay

## 2018-12-22 MED ORDER — LOSARTAN POTASSIUM 100 MG PO TABS: 100.0000 mg | ORAL_TABLET | Freq: Every day | ORAL | 3 refills | Status: DC

## 2018-12-22 MED ORDER — HYDROCHLOROTHIAZIDE 25 MG PO TABS: 25.0000 mg | ORAL_TABLET | Freq: Every day | ORAL | 3 refills | Status: DC

## 2018-12-22 NOTE — Telephone Encounter (Signed)
Pt Losartan/Hctz on backorder. Per Dr. Yong Channel we are going to split Rx.

## 2018-12-22 NOTE — Progress Notes (Signed)
Pt notified of update.  

## 2019-01-21 ENCOUNTER — Encounter: Payer: Self-pay | Admitting: Family Medicine

## 2019-02-01 ENCOUNTER — Encounter: Payer: Self-pay | Admitting: Family Medicine

## 2019-03-09 ENCOUNTER — Encounter: Payer: Self-pay | Admitting: Family Medicine

## 2019-04-04 ENCOUNTER — Encounter: Payer: Self-pay | Admitting: Family Medicine

## 2019-11-24 ENCOUNTER — Other Ambulatory Visit: Payer: Self-pay | Admitting: Family Medicine

## 2019-12-13 ENCOUNTER — Encounter: Payer: No Typology Code available for payment source | Admitting: Family Medicine

## 2019-12-29 ENCOUNTER — Other Ambulatory Visit: Payer: Self-pay | Admitting: Family Medicine

## 2020-01-26 ENCOUNTER — Ambulatory Visit (INDEPENDENT_AMBULATORY_CARE_PROVIDER_SITE_OTHER): Payer: PRIVATE HEALTH INSURANCE | Admitting: Family Medicine

## 2020-01-26 ENCOUNTER — Encounter: Payer: Self-pay | Admitting: Family Medicine

## 2020-01-26 ENCOUNTER — Other Ambulatory Visit: Payer: Self-pay

## 2020-01-26 VITALS — BP 130/86 | HR 74 | Temp 97.2°F | Ht 67.0 in | Wt 175.2 lb

## 2020-01-26 DIAGNOSIS — Z860101 Personal history of adenomatous and serrated colon polyps: Secondary | ICD-10-CM

## 2020-01-26 DIAGNOSIS — I10 Essential (primary) hypertension: Secondary | ICD-10-CM | POA: Diagnosis not present

## 2020-01-26 DIAGNOSIS — F3342 Major depressive disorder, recurrent, in full remission: Secondary | ICD-10-CM

## 2020-01-26 DIAGNOSIS — F411 Generalized anxiety disorder: Secondary | ICD-10-CM

## 2020-01-26 DIAGNOSIS — L649 Androgenic alopecia, unspecified: Secondary | ICD-10-CM

## 2020-01-26 DIAGNOSIS — D72819 Decreased white blood cell count, unspecified: Secondary | ICD-10-CM | POA: Diagnosis not present

## 2020-01-26 DIAGNOSIS — Z0000 Encounter for general adult medical examination without abnormal findings: Secondary | ICD-10-CM | POA: Diagnosis not present

## 2020-01-26 DIAGNOSIS — Z86010 Personal history of colonic polyps: Secondary | ICD-10-CM

## 2020-01-26 DIAGNOSIS — K589 Irritable bowel syndrome without diarrhea: Secondary | ICD-10-CM

## 2020-01-26 LAB — COMPREHENSIVE METABOLIC PANEL
ALT: 9 U/L (ref 0–53)
AST: 19 U/L (ref 0–37)
Albumin: 4.9 g/dL (ref 3.5–5.2)
Alkaline Phosphatase: 45 U/L (ref 39–117)
BUN: 13 mg/dL (ref 6–23)
CO2: 31 mEq/L (ref 19–32)
Calcium: 9.6 mg/dL (ref 8.4–10.5)
Chloride: 96 mEq/L (ref 96–112)
Creatinine, Ser: 0.96 mg/dL (ref 0.40–1.50)
GFR: 85.11 mL/min (ref >60.00)
Glucose, Bld: 109 mg/dL — ABNORMAL HIGH (ref 70–99)
Potassium: 4.5 mEq/L (ref 3.5–5.1)
Sodium: 133 mEq/L — ABNORMAL LOW (ref 135–145)
Total Bilirubin: 0.5 mg/dL (ref 0.2–1.2)
Total Protein: 7.3 g/dL (ref 6.0–8.3)

## 2020-01-26 LAB — CBC WITH DIFFERENTIAL/PLATELET
Basophils Absolute: 0 10*3/uL (ref 0.0–0.1)
Basophils Relative: 0.8 % (ref 0.0–3.0)
Eosinophils Absolute: 0.1 10*3/uL (ref 0.0–0.7)
Eosinophils Relative: 3.2 % (ref 0.0–5.0)
HCT: 39.2 % (ref 39.0–52.0)
Hemoglobin: 13.7 g/dL (ref 13.0–17.0)
Lymphocytes Relative: 35.6 % (ref 12.0–46.0)
Lymphs Abs: 1.2 10*3/uL (ref 0.7–4.0)
MCHC: 35 g/dL (ref 30.0–36.0)
MCV: 95.3 fl (ref 78.0–100.0)
Monocytes Absolute: 0.2 10*3/uL (ref 0.1–1.0)
Monocytes Relative: 7 % (ref 3.0–12.0)
Neutro Abs: 1.8 10*3/uL (ref 1.4–7.7)
Neutrophils Relative %: 53.4 % (ref 43.0–77.0)
Platelets: 283 10*3/uL (ref 150.0–400.0)
RBC: 4.11 Mil/uL — ABNORMAL LOW (ref 4.22–5.81)
RDW: 12.2 % (ref 11.5–15.5)
WBC: 3.4 10*3/uL — ABNORMAL LOW (ref 4.0–10.5)

## 2020-01-26 LAB — LIPID PANEL
Cholesterol: 261 mg/dL — ABNORMAL HIGH (ref 0–200)
HDL: 84 mg/dL (ref >39.00)
LDL Cholesterol: 157 mg/dL — ABNORMAL HIGH (ref 0–99)
NonHDL: 177.09
Total CHOL/HDL Ratio: 3
Triglycerides: 100 mg/dL (ref 0.0–149.0)
VLDL: 20 mg/dL (ref 0.0–40.0)

## 2020-01-26 MED ORDER — LOSARTAN POTASSIUM-HCTZ 100-12.5 MG PO TABS: 1.0000 | ORAL_TABLET | Freq: Every day | ORAL | 3 refills | Status: DC

## 2020-01-26 NOTE — Progress Notes (Signed)
Phone: [phone]    I,Donna Orphanos,acting as a scribe for Garret Reddish, MD.,have documented all relevant documentation on the behalf of Garret Reddish, MD,as directed by  Garret Reddish, MD while in the presence of Garret Reddish, MD.  Subjective:  Patient presents today for their annual physical. Chief complaint-noted.   See problem oriented charting- Review of Systems  Constitutional: Negative.   HENT: Negative.   Eyes: Negative.   Respiratory: Negative.   Cardiovascular: Negative.   Gastrointestinal: Negative.   Genitourinary: Negative.   Musculoskeletal: Negative.   Skin: Negative.   Neurological: Negative.   Endo/Heme/Allergies: Negative.   Psychiatric/Behavioral: Negative for depression, hallucinations, memory loss, substance abuse and suicidal ideas. The patient has insomnia. The patient is not nervous/anxious.     The following were reviewed and entered/updated in epic: Past Medical History:  Diagnosis Date  . Allergy    nasal lavave 3x a day. in past multiple meds- claritin, singulair, nasal spray.   . Depression    wellbutrin 138m XR, pristiq 1027m ambien 1080mor sleep. clonazepam 1 tablet TID prn  . Fear of flying   . GAD (generalized anxiety disorder)    wellbutrin 150m46m, pristiq 100mg55mbien 10mg 72msleep. clonazepam 1 tablet TID prn  . History of Clostridium difficile infection   . Hypertension    losartan HCT 100-12.5mg da37m  . Leukopenia   . Low libido   . OCD (obsessive compulsive disorder)   . Panic disorder    Patient Active Problem List   Diagnosis Date Noted  . GAD (generalized anxiety disorder)     Priority: High  . History of adenomatous polyp of colon 11/24/2018    Priority: Medium  . Leukopenia 12/16/2017    Priority: Medium  . Allergic rhinitis 12/16/2017    Priority: Medium  . Low libido 12/16/2017    Priority: Medium  . IBS (irritable bowel syndrome) 12/15/2016    Priority: Medium  . Depression     Priority: Medium    . Hypertension     Priority: Medium  . 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 Nerve pain 12/15/2016    Priority: Low   Past Surgical History:  Procedure Laterality Date  . FUNCTIONAL ENDOSCOPIC SINUS SURGERY  2003  . NASAL SINUS SURGERY  2010   baloonoplasty  . VASECTOMY      Family History  Problem Relation Age of Onset  . Other Mother        smoldering myeloma. precursor to multiple myeloma.   . Multiple myeloma Mother   . Hypertension Father   . Anxiety disorder Father   . Colon polyps Father        tubularvillous adenoma  . Lupus Sister   . Prostate cancer Maternal Grandfather        in 80s  . 63sstate cancer Paternal Grandfather        in 80s  . 17smach cancer Neg Hx   . Pancreatic cancer Neg Hx     Medications- reviewed and updated Current Outpatient Medications  Medication Sig Dispense Refill  . ALPRAZolam (XANAX) 0.5 MG tablet Take 0.5 mg by mouth as needed. Takes prior to flights    . Azelastine-Fluticasone 137-50 MCG/ACT SUSP USE 1 SPRAY INTO EACH NOSTRIL TWICE A DAY 69 g 2  . buPROPion (WELLBUTRIN XL) 150 MG 24 hr tablet Take 150 mg by mouth daily.    . clonazePAM (KLONOPIN) 0.5 MG tablet Take 1 tablet in AM, half tablet in afternoon, 1  tablet in PM (Patient taking differently: Take 1/2 tablet in AM and 1/2 tablet in PM) 4 tablet 0  . desvenlafaxine (PRISTIQ) 100 MG 24 hr tablet Take 100 mg by mouth daily.    Marland Kitchen gabapentin (NEURONTIN) 300 MG capsule Take 1 capsule (300 mg total) by mouth 2 (two) times daily. (Patient taking differently: Take 300 mg by mouth 3 (three) times daily. 1 in the AM, ! In the afternoon, 2 in the PM) 90 capsule 3  . losartan-hydrochlorothiazide (HYZAAR) 100-12.5 MG tablet Take 1 tablet by mouth daily. 90 tablet 3  . propranolol (INDERAL) 20 MG tablet Take 20 mg by mouth 3 (three) times daily.    . traZODone (DESYREL) 50 MG tablet Take 25 mg by mouth at bedtime as needed for sleep.      No current facility-administered  medications for this visit.    Allergies-reviewed and updated Allergies  Allergen Reactions  . Doxycycline Nausea And Vomiting    Social History   Social History Narrative   Married 2002. 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 Oropharynx normal Neck: no thyromegaly CV: RRR no murmurs rubs or gallops Lungs: CTAB no crackles, wheeze, rhonchi Abdomen: soft/nontender/nondistended/normal bowel sounds. No rebound or guarding.  Ext: no edema Skin: warm, dry Neuro: grossly normal, moves all extremities, PERRLA   Assessment and Plan   44 y.o. male presenting for annual physical.  Health Maintenance counseling: 1. Anticipatory guidance: Patient counseled regarding regular dental exams yes q6 months, eye exams done 4 yrs ago not up to date- denies any vision issues other than wearing readers,  avoiding smoking and second hand smoke none , limiting alcohol to 1 beverage a month- well under 2 per day recs which is excellent.   2. Risk factor reduction:  Advised patient of need for regular exercise and diet rich and fruits and vegetables to reduce risk of heart attack and stroke. Exercise- walks 3-5 miles and strength training 5 days a week, plans to hike [place]. Diet- Healthy diet. Has done a great job maintaining weight despite pandemic  Wt Readings from Last 3 Encounters:  01/26/20 175 lb 4 oz (79.5 kg)  10/27/18 174 lb (78.9 kg)  10/04/18 171 lb 3.2 oz (77.7 kg)    3. Immunizations/screenings/ancillary studies- fully up to date  Immunization History  Administered Date(s) Administered  . Influenza,inj,Quad PF,6+ Mos 06/17/2017, 10/04/2018, 08/20/2019  . Influenza-Unspecified 07/30/2016  . PFIZER SARS-COV-2 Vaccination 11/16/2019, 12/14/2019  . Tdap 02/11/2017  4. Prostate cancer screening- PSAs had been checked due to testosterone therapy in past. We agreed with family history in both grandparents to screen every other years starting age 59.   39. Colon cancer screening - patient with colonoscopy last year due to a father with adenoma at age 27 and patient with bright red blood per rectum.  He is on  5 year repeat  6. Skin cancer screening- dermatology annually. Really good about sunscreen 7. Never smoker  Status of chronic or acute concerns   # social update- has been doing more hiking/outdoors and less online with pandemic. Getting a dog soon. Happiest he has been in a long time. Some inspiration from social dilemma.   #hypertension S: medication: Losartan-HCTZ 100-12.5 mg, propranolol 20 mg 3 times a day-no primarily for anxiety Home readings #s: averaging 125/80 BP Readings from Last 3 Encounters:  01/26/20 130/86  11/17/18 113/78  10/27/18 114/80  A/P: Stable. Continue current medications.   # Depression/GAD/insomnia-managed by [facility] S: Medication: Pristiq 100 mg extended release, Wellbutrin XL 150 mg, gabapentin and Clonazepam 0.5 mg now down to daily-and has decreased to half tablet. Propranolol helped with some of physical symptoms of weaning off clonazepam.   Trazodone somewhat helpful for sleep Flight related anxiety-patient takes alprazolam as needed for flights-will COVID-19 has not been traveling as frequently Depression screen [facility] 2/9 01/26/2020 10/04/2018 12/16/2017  Decreased Interest 0 0 0  Down, Depressed, Hopeless 0 0 0  PHQ - 2 Score 0 0 0  Altered sleeping 1 - 1  Tired, decreased energy 0 - 2  Change in appetite 0 - 0   Feeling bad or failure about yourself  0 - 0  Trouble concentrating 0 - 1  Moving slowly or fidgety/restless 0 - 0  Suicidal thoughts 0 - 0  PHQ-9 Score 1 - 4  Difficult doing work/chores Somewhat difficult - Somewhat difficult  A/P: full remission of depression. Anxiety well controlled. Continue follow up with [facility]   #Allergies-patient compliant with Astelin fluticasone combination spray-Dymista.nasal wash twice a day   #Mild leukopenia-HIV has been negative. Path smear review 12/16/17 pretty reassuring.   Wellbutrin can be associated with leukopenia.  Update CBC with differential today.  If any worsening could consider hematology consult   #Low testosterone-patient had done well with testosterone therapy- he got tired of doing injections- lower libido without but managing  Recommended follow up: Return in about 1 year (around 01/25/2021) for physical or sooner if needed.  Lab/Order associations: fasting   ICD-10-CM   1. Preventative health care  Z00.00 CBC with Differential/Platelet    Comprehensive metabolic panel    Lipid panel  2. GAD (generalized anxiety disorder)  F41.1   3. Leukopenia, unspecified type  D72.819   4. Irritable bowel syndrome, unspecified type  K58.9   5. Essential hypertension  I10 CBC with Differential/Platelet    Comprehensive metabolic panel    Lipid panel  6. History of adenomatous polyp of colon  Z86.010   7. Recurrent major depressive disorder, in full remission ([place])  F33.42   8. Male pattern baldness  L64.9    Meds ordered this encounter  Medications  . losartan-hydrochlorothiazide (HYZAAR) 100-12.5 MG tablet    Sig: Take 1 tablet by mouth daily.    Dispense:  90 tablet    Refill:  3   The entirety of the information documented in the History of Present Illness, Review of Systems and Physical Exam were personally obtained by me. Portions of this information were initially documented by the CMA and reviewed by me for thoroughness and  accuracy.   Garret Reddish, MD   Return precautions advised.  Garret Reddish, MD

## 2020-01-26 NOTE — Patient Instructions (Addendum)
Please stop by lab before you go If you have mychart- we will send your results within 3 business days of [place] receiving them.  If you do not have mychart- we will call you about results within 5 business days of [place] receiving them.   Thrilled you are doing so well!   Return in about 1 year (around 01/25/2021) for physical or sooner if needed. - particularly if home blood pressure trends up over 140/90.

## 2020-03-06 ENCOUNTER — Encounter: Payer: Self-pay | Admitting: Family Medicine

## 2020-05-01 ENCOUNTER — Encounter: Payer: Self-pay | Admitting: Family Medicine

## 2020-05-07 ENCOUNTER — Encounter: Payer: Self-pay | Admitting: Family Medicine

## 2020-05-07 ENCOUNTER — Other Ambulatory Visit: Payer: Self-pay

## 2020-05-07 ENCOUNTER — Ambulatory Visit (INDEPENDENT_AMBULATORY_CARE_PROVIDER_SITE_OTHER): Payer: PRIVATE HEALTH INSURANCE | Admitting: Family Medicine

## 2020-05-07 VITALS — BP 126/84 | HR 78 | Temp 96.4°F | Ht 67.0 in | Wt 178.0 lb

## 2020-05-07 DIAGNOSIS — I10 Essential (primary) hypertension: Secondary | ICD-10-CM

## 2020-05-07 NOTE — Progress Notes (Signed)
Phone [phone] In person visit   Subjective:   Jose Berry is a 44 y.o. year old very pleasant male patient who presents for/with See problem oriented charting Chief Complaint  Patient presents with  . Covid Exposure    questions about covid and work     This visit occurred during the [employer] public health emergency.  Safety protocols were in place, including screening questions prior to the visit, additional usage of staff PPE, and extensive cleaning of exam room while observing appropriate contact time as indicated for disinfecting solutions.   Past Medical History-  Patient Active Problem List   Diagnosis Date Noted  . GAD (generalized anxiety disorder)     Priority: High  . History of adenomatous polyp of colon 11/24/2018    Priority: Medium  . Leukopenia 12/16/2017    Priority: Medium  . Allergic rhinitis 12/16/2017    Priority: Medium  . Low libido 12/16/2017    Priority: Medium  . IBS (irritable bowel syndrome) 12/15/2016    Priority: Medium  . Depression     Priority: Medium  . Hypertension     Priority: Medium  . 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 Nerve pain 12/15/2016    Priority: Low    Medications- reviewed and updated Current Outpatient Medications  Medication Sig Dispense Refill  . ALPRAZolam (XANAX) 0.5 MG tablet Take 0.5 mg by mouth as needed. Takes prior to flights    . Azelastine-Fluticasone 137-50 MCG/ACT SUSP USE 1 SPRAY INTO EACH NOSTRIL TWICE A DAY 69 g 2  . buPROPion (WELLBUTRIN XL) 150 MG 24 hr tablet Take 150 mg by mouth daily.    . clonazePAM (KLONOPIN) 0.5 MG tablet Take 1 tablet in AM, half tablet in afternoon, 1 tablet in PM (Patient taking differently: Take 1/2 tablet in AM and 1/2 tablet in PM) 4 tablet 0  . desvenlafaxine (PRISTIQ) 100 MG 24 hr tablet Take 100 mg by mouth daily.    Marland Kitchen gabapentin (NEURONTIN) 300 MG capsule Take 1 capsule (300 mg total) by mouth 2 (two) times daily. (Patient taking differently:  Take 300 mg by mouth 3 (three) times daily. 1 in the AM, ! In the afternoon, 2 in the PM) 90 capsule 3  . losartan-hydrochlorothiazide (HYZAAR) 100-12.5 MG tablet Take 1 tablet by mouth daily. 90 tablet 3  . propranolol (INDERAL) 20 MG tablet Take 20 mg by mouth 3 (three) times daily.    . traZODone (DESYREL) 50 MG tablet Take 25 mg by mouth at bedtime as needed for sleep.      No current facility-administered medications for this visit.     Objective:  BP 126/84   Pulse 78   Temp (!) 96.4 F (35.8 C) (Temporal)   Ht 5\' 7"  (1.702 m)   Wt 178 lb (80.7 kg)   SpO2 99%   BMI 27.88 kg/m  Gen: NAD, resting comfortably    Assessment and Plan   # Covid 19 concerns S: had potential high risk request from work which somewhat rattled him. 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  I do think it is reasonable for him to get a month booster once this becomes available.  I also think he can lower  his overall risk n95 but he feels like he is going to be in a high risk situation-with that being said if he is pushed towards a much higher risk situation-still reasonable to avoid those situations-I am happy to write a letter of support if needed  #hypertension S: medication: losartan hctz 100-12.5mg , propranolol 20mg  3x a day Home readings #s: not above 130 at home BP Readings from Last 3 Encounters:  05/07/20 126/84  01/26/20 130/86  11/17/18 113/78  A/P: Stable. Continue current medications.   Recommended follow up: keep physical next year.  Future Appointments  Date Time Provider [place]  01/28/2021  8:00 AM Marin Olp, MD [facility]    Lab/Order associations:   ICD-10-CM   1. Essential hypertension  I10    Time Spent: 28 minutes of total  time (1:32 PM- 2:00 PM) was spent on the date of the encounter performing the following actions: chart review prior to seeing the patient, obtaining history, performing a medically necessary exam, counseling on the treatment plan, placing orders, and documenting in our EHR.   Return precautions advised.  Garret Reddish, MD

## 2020-05-07 NOTE — Patient Instructions (Addendum)
Health Maintenance Due  Topic Date Due  . Hepatitis C Screening will get at next lab  Never done  . INFLUENZA VACCINE - will complete later in flu season (please let us know if you get this at another location so we can update your chart) . We should have vaccination here in 1-2 months - can call back for an appointment.   04/15/2020    I discussed with patient is lower overall risk for death or hospitalization based off vaccination status.  I do think it is reasonable for him to get a month booster once this becomes available.  I also think he can lower his overall risk n95 but he feels like he is going to be in a high risk situation-with that being said if he is pushed towards a much higher risk situation-still reasonable to avoid those situations-I am happy to write a letter of support if needed  Blood pressure looks great!   Recommended follow up: keep physical next year

## 2020-06-24 ENCOUNTER — Encounter: Payer: Self-pay | Admitting: Family Medicine

## 2020-06-24 ENCOUNTER — Other Ambulatory Visit: Payer: Self-pay

## 2021-01-28 ENCOUNTER — Encounter: Payer: PRIVATE HEALTH INSURANCE | Admitting: Family Medicine

## 2021-03-01 ENCOUNTER — Other Ambulatory Visit: Payer: Self-pay

## 2021-03-01 ENCOUNTER — Ambulatory Visit (INDEPENDENT_AMBULATORY_CARE_PROVIDER_SITE_OTHER): Payer: PRIVATE HEALTH INSURANCE | Admitting: Family

## 2021-03-01 ENCOUNTER — Encounter: Payer: Self-pay | Admitting: Family

## 2021-03-01 VITALS — BP 157/108 | HR 77 | Temp 97.6°F | Ht 67.0 in | Wt 179.2 lb

## 2021-03-01 DIAGNOSIS — F329 Major depressive disorder, single episode, unspecified: Secondary | ICD-10-CM | POA: Diagnosis not present

## 2021-03-01 DIAGNOSIS — F411 Generalized anxiety disorder: Secondary | ICD-10-CM | POA: Diagnosis not present

## 2021-03-03 ENCOUNTER — Encounter: Payer: Self-pay | Admitting: Family

## 2021-03-03 NOTE — Progress Notes (Signed)
Acute Office Visit  Subjective:    Patient ID: Jose Berry, male    DOB: [birth year], 45 y.o.   MRN: [id number]  Chief Complaint  Patient presents with   Anxiety   Depression    HPI Patient is in today with c/o increased anxiety and depression recently related to stressors at work. Patient reports his supervisor telling him that he has loss his passion for work and is not a good employees. This really hurt his feelings and he is very tearful today and anxious. He does not think he can go back to work right now due to the tension and stress of the environment. Has feeling of helplessness and hopelessness. No thoughts of death or dying. His wife is here with him who is very supportive. He is planning to see a Social worker. Has a psychiatrist.  Past Medical History:  Diagnosis Date   Allergy    nasal lavave 3x a day. in past multiple meds- claritin, singulair, nasal spray.    Depression    wellbutrin 150mg  XR, pristiq 100mg . ambien 10mg  for sleep. clonazepam 1 tablet TID prn   Fear of flying    GAD (generalized anxiety disorder)    wellbutrin 150mg  XR, pristiq 100mg . ambien 10mg  for sleep. clonazepam 1 tablet TID prn   History of Clostridium difficile infection    Hypertension    losartan HCT 100-12.5mg  daily   Leukopenia    Low libido    OCD (obsessive compulsive disorder)    Panic disorder     Past Surgical History:  Procedure Laterality Date   FUNCTIONAL ENDOSCOPIC SINUS SURGERY  2003   NASAL SINUS SURGERY  2010   baloonoplasty   VASECTOMY      Family History  Problem Relation Age of Onset   Other Mother        smoldering myeloma. precursor to multiple myeloma.    Multiple myeloma Mother    Hypertension Father    Anxiety disorder Father    Colon polyps Father        tubularvillous adenoma   Lupus Sister    Prostate cancer Maternal Grandfather        in 43s   Prostate cancer Paternal Grandfather        in 62s   Stomach cancer Neg Hx    Pancreatic cancer Neg Hx      Social History   Socioeconomic History   Marital status: Married    Spouse name: Not on file   Number of children: Not on file   Years of education: Not on file   Highest education level: Not on file  Occupational History   Not on file  Tobacco Use   Smoking status: Never   Smokeless tobacco: Never  Vaping Use   Vaping Use: Never used  Substance and Sexual Activity   Alcohol use: Yes    Alcohol/week: 1.0 standard drink    Types: 1 Standard drinks or equivalent per week    Comment: Occ x 1 a month   Drug use: No   Sexual activity: Yes    Partners: Female  Other Topics Concern   Not on file  Social History Narrative   Married 2002. 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 Takes prior to flights     Azelastine-Fluticasone 137-50 MCG/ACT SUSP USE 1 SPRAY INTO EACH NOSTRIL TWICE A DAY 69 g 2   clonazePAM (KLONOPIN) 0.5 MG tablet Take 1 tablet in AM, half tablet in afternoon, 1 tablet in PM (Patient taking differently: Take 1/2 tablet in AM and 1/2 tablet in PM) 4 tablet 0   desvenlafaxine (PRISTIQ) 100 MG 24 hr tablet Take 100 mg by mouth daily.     gabapentin (NEURONTIN) 300 MG capsule Take 1 capsule (300 mg total) by mouth 2 (two) times daily. (Patient taking differently: Take 300 mg by mouth 3 (three) times daily. 1 in the AM, ! In the afternoon, 2 in the  PM) 90 capsule 3   losartan-hydrochlorothiazide (HYZAAR) 100-12.5 MG tablet Take 1 tablet by mouth daily. 90 tablet 3   propranolol (INDERAL) 20 MG tablet Take 20 mg by mouth 3 (three) times daily.     traZODone (DESYREL) 50 MG tablet Take 25 mg by mouth at bedtime as needed for sleep.      No facility-administered medications prior to visit.    Allergies  Allergen Reactions   Doxycycline Nausea And Vomiting    Review of Systems  Respiratory: Negative.    Cardiovascular: Negative.   Gastrointestinal: Negative.   Endocrine: Negative.   Musculoskeletal: Negative.   Skin: Negative.   Allergic/Immunologic: Negative.   Neurological: Negative.   Psychiatric/Behavioral:  Positive for sleep disturbance. Negative for self-injury and suicidal ideas. The patient is nervous/anxious.   All other systems reviewed and are negative.     Objective:    Physical Exam Vitals and nursing note reviewed.  Constitutional:      Appearance: Normal appearance.  Cardiovascular:     Rate and Rhythm: Normal rate and regular rhythm.     Pulses: Normal pulses.     Heart sounds: Normal heart sounds.  Musculoskeletal:        General: Normal range of motion.     Cervical back: Normal range of motion and neck supple.  Skin:    General: Skin is warm and dry.  Neurological:     General: No focal deficit present.     Mental Status: He is alert and oriented to person, place, and time.  Psychiatric:     Comments: Crying, anxious     BP (!) 157/108   Pulse 77   Temp 97.6 F (36.4 C) (Temporal)   Ht $R'5\' 7"'SQ$  (1.702 m)   Wt 179 lb 3.2 oz (81.3 kg)   SpO2 99%   BMI 28.07 kg/m  Wt Readings from Last 3 Encounters:  03/01/21 179 lb 3.2 oz (81.3 kg)  05/07/20 178 lb (80.7 kg)  01/26/20 175 lb 4 oz (79.5 kg)    Health Maintenance Due  Topic Date Due   Hepatitis C Screening  Never done    There are no preventive care reminders to display for this patient.   Lab Results  Component Value Date   TSH  1.53 12/16/2017   Lab Results  Component Value Date  WBC 3.4 (L) 01/26/2020   HGB 13.7 01/26/2020   HCT 39.2 01/26/2020   MCV 95.3 01/26/2020   PLT 283.0 01/26/2020   Lab Results  Component Value Date   NA 133 (L) 01/26/2020   K 4.5 01/26/2020   CO2 31 01/26/2020   GLUCOSE 109 (H) 01/26/2020   BUN 13 01/26/2020   CREATININE 0.96 01/26/2020   BILITOT 0.5 01/26/2020   ALKPHOS 45 01/26/2020   AST 19 01/26/2020   ALT 9 01/26/2020   PROT 7.3 01/26/2020   ALBUMIN 4.9 01/26/2020   CALCIUM 9.6 01/26/2020   GFR 85.11 01/26/2020   Lab Results  Component Value Date   CHOL 261 (H) 01/26/2020   Lab Results  Component Value Date   HDL 84.00 01/26/2020   Lab Results  Component Value Date   LDLCALC 157 (H) 01/26/2020   Lab Results  Component Value Date   TRIG 100.0 01/26/2020   Lab Results  Component Value Date   CHOLHDL 3 01/26/2020   No results found for: HGBA1C     Assessment & Plan:   Problem List Items Addressed This Visit     GAD (generalized anxiety disorder) - Primary   Depression     Note given to be out for 2 weeks. Follow-up with psychiatry and therapist. Continue current meds. Encouraged FMLA given the circumstances and his mental fragility.    Kennyth Arnold, FNP

## 2021-03-05 ENCOUNTER — Ambulatory Visit: Payer: PRIVATE HEALTH INSURANCE | Admitting: Family

## 2021-03-06 ENCOUNTER — Telehealth: Payer: Self-pay | Admitting: *Deleted

## 2021-03-06 NOTE — Telephone Encounter (Signed)
LVM FMLA ready to be pick up  Left at front office  Placed copy to be scan

## 2021-03-08 ENCOUNTER — Encounter: Payer: Self-pay | Admitting: Family Medicine

## 2021-03-13 ENCOUNTER — Encounter: Payer: Self-pay | Admitting: Family Medicine

## 2021-03-13 NOTE — Telephone Encounter (Signed)
See below

## 2021-03-14 NOTE — Telephone Encounter (Signed)
Patients appt has been changed to virtual.

## 2021-03-15 ENCOUNTER — Encounter: Payer: Self-pay | Admitting: Family Medicine

## 2021-03-15 ENCOUNTER — Telehealth (INDEPENDENT_AMBULATORY_CARE_PROVIDER_SITE_OTHER): Payer: No Typology Code available for payment source | Admitting: Family Medicine

## 2021-03-15 VITALS — BP 135/81

## 2021-03-15 DIAGNOSIS — F329 Major depressive disorder, single episode, unspecified: Secondary | ICD-10-CM | POA: Diagnosis not present

## 2021-03-15 DIAGNOSIS — I10 Essential (primary) hypertension: Secondary | ICD-10-CM | POA: Diagnosis not present

## 2021-03-15 DIAGNOSIS — F411 Generalized anxiety disorder: Secondary | ICD-10-CM | POA: Diagnosis not present

## 2021-03-15 DIAGNOSIS — U071 COVID-19: Secondary | ICD-10-CM | POA: Diagnosis not present

## 2021-03-15 NOTE — Progress Notes (Signed)
Phone [phone] Virtual visit via Video note   Subjective:  Chief complaint: Chief Complaint  Patient presents with   Covid Positive   Anxiety    This visit type was conducted due to national recommendations for restrictions regarding the COVID-19 Pandemic (e.g. social distancing).  This format is felt to be most appropriate for this patient at this time balancing risks to patient and risks to population by having him in for in person visit.  No physical exam was performed (except for noted visual exam or audio findings with Telehealth visits).    Our team/I connected with Jose Berry at  9:20 AM EDT by a video enabled telemedicine application (doxy.me or caregility through epic) and verified that I am speaking with the correct person using two identifiers.  Location patient: [place] Location provider: [facility], office Persons participating in the virtual visit:  patient  Our team/I discussed the limitations of evaluation and management by telemedicine and the availability of in person appointments. In light of current covid-19 pandemic, patient also understands that we are trying to protect them by minimizing in office contact if at all possible.  The patient expressed consent for telemedicine visit and agreed to proceed. Patient understands insurance will be billed.   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 Takes prior to flights     Azelastine-Fluticasone 137-50 MCG/ACT SUSP USE 1 SPRAY INTO EACH NOSTRIL TWICE A DAY 69 g 2   clonazePAM (KLONOPIN) 0.5 MG tablet Take 1 tablet in AM, half tablet in afternoon, 1 tablet in PM (Patient taking differently: Take 1/2 tablet in AM and 1/2 tablet in PM) 4 tablet 0   desvenlafaxine (PRISTIQ) 100 MG 24 hr tablet Take 100 mg by mouth daily.     gabapentin (NEURONTIN) 300 MG capsule Take 1 capsule (300 mg total) by mouth 2 (two) times daily. 90 capsule 3   losartan-hydrochlorothiazide (HYZAAR) 100-12.5 MG tablet Take 1 tablet by mouth daily. 90 tablet 3   propranolol (INDERAL) 20 MG tablet Take 20 mg by mouth 3 (three) times daily.     traZODone (DESYREL) 50 MG tablet Take 25 mg by mouth at bedtime as needed for sleep.      No current facility-administered medications for this visit.     Objective:  BP 135/81  self reported vitals Lungs: nonlabored, normal respiratory rate  Appears slightly disheveled and anxious-when discussing work situation- appears increasingly anxious Appears down, depressed at baseline- no tears during visit     Assessment and Plan   # GAD/work related stress- potential PTSD like event #depression S:Medication: pristiq 100mg , gbapentin 300mg  BID and has alprazolam but only for flights now, also clonazepam 0.25 mg  three times a day through psychiatry   - last visit with psychiatry in march 2022.  -Counseling: patient has been seen in the past. Has been trying to get established again looking for male provider has been difficult. I had asked my team to give him the following info but this was not yet delivered "Please call [phone] to schedule a visit with [facility]" we discussed this today as an option  - patient reports a lot of work stress in general and experiencing some  burnout - had even considered looking for other jobs - situation intensified with new supervisor in June- patient had first meeting on Wednesday June 15th. Extremely stressful meeting and patient left very discouraged and in tears. Had visit on 03/01/21 and due to severity of increased anxiety was recommended FMLA for 2 weeks- when this was completed by the provider she extended this due to seveirty of symptoms to 04-12-21 patient has not yet picked up paperwork (was not aware available)- he will come by and pick this up. Patient with severe anxiety particularly about the thought of returning to work- feels incapable of doing so. He states "this is the worst thing that has ever happened to me at work in my life". Anytime he thinks about work has racing thoughts and severe anxiety- even with distraction frequently recurs- has anxiety about multiple topics but work is strongest stressor. Recurrent intrusive thoughts about work situation replay in his head.   As per mychart he reported the following  "Upon entering, he commented that the chairs in my office needed replacing, photographed them, and then promised to replace them.  He sat down, and I immediately talked about consolidation of operations to allow for more time to connect with students.  He then asked me what my definition of success is. I responded that I considered team success and enrollment goals as part of professional success. Also, I stated that listening to students for their perspective on what they want to do is very important to me.  Next he asked me what I thought of my supervisor Sabra Heck. I mentioned that I respected Rolley Sims, but that he often puts too much on himself, which can sometimes delay things. I mentioned that I was concerned for his health as I had seen him fall asleep at work and continually miss assignments that he had delegated to himself. I commented it seemed as though he had lost his desire to work at [employer], which was  concerning for me.  He then asked me about [employer] enrollment and told me there was no trust with the [facility] program and our department. He mentioned that Dr. Danise Mina did not trust me and that I world need to earn it.  Last and most hurtful, He stated that he felt I had lost my passion and that I was no longer well suited for this role. He intonated that I had not been doing my job and it was easily apparent. Apparently he did not know that I received a 20% raise only months ago and was promoted by our team.He said he apologized if working at [facility] [facility] [employer] was the reason for the loss of my drive and passion. In fact, he mentioned that others have noticed, and it is "very apparent" to anyone. At no time did he suggest any concern on his part for my well behaved being or solicit my opinion. It was simply stated twice in a row by him. He suggested I make more phone calls  and focus on [employer] enrollment, and the other parts of my job were not enough to fulfill my time therefore I was not doing my job properly. He emphatically stated that Dr. Danise Mina (him) did not believe I was doing anything for the program. I mentioned that I was making 25 calls per week per the suggestion of my supervisor Sabra Heck but he said that was news to him and not enough. I mentioned that I was part of 3 programs, and that out [facility] program was up 30%. He neither nodded not reacted. When I mentioned that [facility] did not do a good job of acknowledging success he agreed with me, which made his reaction unsurprising regarding my performance reviews.I indicated that the general negative attitude about accomplishments was a major problem with the university.  Instead, he continued by saying that he would need for me to make up to 100 phone calls a week. This is not something that I had ever heard before, and I told him that I was instructed to make 25. The disconnect between my current instructions and his instructions upon meeting  him for the first time were very apparent.I did not ask him how he came to this conclusion that I was lazy but did remind him of the enrollment goals of other programs. At the end of the conversation he said he looked forward to giving me more leadership opportunities in the future which was confusing.I did not say anything further and he walked out of my office with his drink in his hand. His drink spilled slightly as he opened my door with his left hand.  His behavior with was both bullying toxic, and offensive. It made me seek medical treatment. It also was apparent that for the first time in talking with me he had not looked at my prior performance reviews nor talked to my supervisor of almost 5 years.  Anton Danis February 28, 2020" GAD 7 : Generalized Anxiety Score 03/15/2021 01/26/2020  Nervous, Anxious, on Edge 3 1  Control/stop worrying 3 1  Worry too much - different things 3 1  Trouble relaxing 3 1  Restless 0 0  Easily annoyed or irritable 1 0  Afraid - awful might happen 3 0  Total GAD 7 Score 16 4  Anxiety Difficulty Extremely difficult Not difficult at all   Depression screen [facility] 2/9 03/15/2021 03/01/2021 05/07/2020  Decreased Interest 1 0 0  Down, Depressed, Hopeless 1 1 0  PHQ - 2 Score 2 1 0  Altered sleeping 1 2 0  Tired, decreased energy 1 0 0  Change in appetite 0 1 0  Feeling bad or failure about yourself  1 1 0  Trouble concentrating 0 0 0  Moving slowly or fidgety/restless 0 0 0  Suicidal thoughts 0 0 0  PHQ-9 Score 5 5 0  Difficult doing work/chores Very difficult Very difficult Not difficult at all  A/P: 45 year old male with baseline GAD with significant recent stressors/traumatic event at work causing severe anxiety/racing thoughts.  Patient is unable to work at present-causes recurrent symptoms every time he even thinks about working.  Even when he is not thinking about working tends to have recurrent thoughts-potential PTSD-like event occurring here.  I have  recommended he follow-up with psychotherapy/psychology-gave the name for [facility] and if he is not able to get in soon recommended considering tela doc or similar service.  At baseline patient is treated by psychiatry for GAD-with significant worsening recently  I recommended him schedule next available appointment-I would also like for them and psychology to weigh in on when he can return to work.  We are tentatively writing 6 weeks-it is possible this will be shortened or extended depending on course with psychology and psychiatry. -Significant worsening of GAD 7 score up to 16 from baseline of less than 5-I would like to see him return to under 5 if possible prior to returning to work as work and of itself may worsen anxiety -depression appears reasonably controlled but this has potential to worsen if anxiety is not better controlled -I am not going to make medication changes (continue current meds for now)- I want patient to discuss this with psychiatry- could consider increase in clonazepam short term but on other hand he has aggressively worked to reduce this medicine with psychiatry so I hesitate to make adjustments -he is going to keep me updated on psych visits  # Covid 19 positive S: symptoms include chills, nasal congestion, throat clearing, fever, diarrhea, fatigue. First day of symptoms Thursday June 23rd. He has had improvement in respiratory symptoms. Last fever was around the 26th.  -wife also ill starting 2 days prior- daughters have been ok so far A/P: Patient with testing confirming covid 19 with first day of covid 19 symptoms June 23rd Vaccination status:x3 with last in october   Therefore: - recommended patient watch closely for shortness of breath or confusion or worsening symptoms and if those occur patient should contact us immediately or seek care in the emergency department -recommended patient consider purchasing pulse oximeter and if levels 94% or below  persistently- seek care at the hospital - Patient needs to self isolate  for at least 5 days since first symptom AND at least 24 hours fever free without fever reducing medications AND have improvement in respiratory symptoms . After 5 days can end self isolation but still needs to wear mask for additional 5 days - he is using an n95 if needed to leave the home but trying to stay home.  - earliest possible day out of isolation July 4th, recommendations for patient - work note needed- but see discussion above about mental health - earliest from covid would be July 4th.  -Patient should inform close contacts about exposure (anyone patient been around unmasked for more than 15 minutes)  - out of time frame for outpatient therapeutic options including paxlovid (and risk of rebound), molnupiravir, MAB infusion  #hypertension S: medication: losartan hctz 100-12.5 mg and propranolol 20 mg TID (also for anxiety) BP Readings from Last 3 Encounters:  03/15/21 (!) 141/83--> but 135/81 earlier this morning when not discussing stressors  03/01/21 (!) 157/108  05/07/20 126/84  A/P: Blood pressure elevated during our visit-outside of visits he reports 135/81 earlier today white coat hypertension element plus discussing stressful situation at work seems to be driving BP up- suspect BP may be elevated at work until anxiety better controlled. Continue current meds for now and he will continue to monitor BP at home with goal <135/85  Recommended follow up: 4 weeks if not released back to work by psychiatry/psychology  Lab/Order associations:   ICD-10-CM   1. COVID-19  U07.1     2. GAD (generalized anxiety disorder)  F41.1     3. Major depressive disorder, remission status unspecified, unspecified whether recurrent  F32.9      Return precautions advised.  Garret Reddish, MD

## 2021-03-15 NOTE — Patient Instructions (Addendum)
Health Maintenance Due  Topic Date Due   Hepatitis C Screening Will discuss at his next in office visit.  Never done    Recommended follow up: No follow-ups on file.

## 2021-03-21 ENCOUNTER — Encounter: Payer: Self-pay | Admitting: Family Medicine

## 2021-03-28 ENCOUNTER — Other Ambulatory Visit: Payer: Self-pay

## 2021-03-28 ENCOUNTER — Encounter: Payer: Self-pay | Admitting: Family Medicine

## 2021-03-29 ENCOUNTER — Other Ambulatory Visit: Payer: Self-pay

## 2021-03-29 ENCOUNTER — Encounter: Payer: Self-pay | Admitting: Family Medicine

## 2021-03-29 MED ORDER — SILDENAFIL CITRATE 100 MG PO TABS: 100.0000 mg | ORAL_TABLET | Freq: Every day | ORAL | 5 refills | Status: AC | PRN

## 2021-04-02 ENCOUNTER — Encounter: Payer: Self-pay | Admitting: Family Medicine

## 2021-04-02 ENCOUNTER — Other Ambulatory Visit: Payer: Self-pay | Admitting: Family Medicine

## 2021-04-05 ENCOUNTER — Encounter: Payer: Self-pay | Admitting: Family Medicine

## 2021-04-10 ENCOUNTER — Other Ambulatory Visit: Payer: Self-pay | Admitting: Family Medicine

## 2021-05-01 NOTE — Progress Notes (Incomplete)
Phone [phone] In person visit   Subjective:   Jose Berry is a 45 y.o. year old very pleasant male patient who presents for/with See problem oriented charting No chief complaint on file.   This visit occurred during the SARS-CoV-2 public health emergency.  Safety protocols were in place, including screening questions prior to the visit, additional usage of staff PPE, and extensive cleaning of exam room while observing appropriate contact time as indicated for disinfecting solutions.   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 Takes prior to flights     Azelastine-Fluticasone 137-50 MCG/ACT SUSP SPRAY 1 SPRAY INTO EACH NOSTRIL TWICE A DAY 23 g 2   clonazePAM (KLONOPIN) 0.5 MG tablet Take 1 tablet in AM, half tablet in afternoon, 1 tablet in PM (Patient taking differently: Take 1/2 tablet in AM and 1/2 tablet in PM) 4 tablet 0   desvenlafaxine (PRISTIQ) 100 MG 24 hr tablet Take 100 mg by mouth daily.     gabapentin (NEURONTIN) 300 MG capsule Take 1 capsule (300 mg total) by mouth 2 (two) times daily. 90 capsule 3   losartan-hydrochlorothiazide (HYZAAR) 100-12.5 MG tablet TAKE 1 TABLET BY MOUTH EVERY DAY 90 tablet 3   propranolol (INDERAL) 20 MG tablet Take 20 mg by mouth 3 (three) times daily.     sildenafil (VIAGRA) 100 MG tablet Take 1 tablet (100 mg total) by mouth daily as needed for erectile dysfunction. 10 tablet 5   traZODone (DESYREL) 50 MG tablet Take 25 mg by mouth at  bedtime as needed for sleep.      No current facility-administered medications for this visit.     Objective:  There were no vitals taken for this visit. Gen: NAD, resting comfortably CV: RRR no murmurs rubs or gallops Lungs: CTAB no crackles, wheeze, rhonchi Abdomen: soft/nontender/nondistended/normal bowel sounds. No rebound or guarding.  Ext: no edema Skin: warm, dry Neuro: grossly normal, moves all extremities  ***    Assessment and Plan    # Video Visit for COVID-19 on 03/15/2021 S:Pateitn started experiencing chills, nasal congestion, thraot clearing, fever, diarrhea and fatigue. Symptoms started June 23rd. He improved in respiratory symptoms. His last fever was around the 26th.   -azelastine-fluticasone 137-50 mcg twice daily.  -He had 3 vaccinations in last October.   # GAD with Dutch Quint, FNP on 03/01/2021 S:Patient reported increased anxiety and depression due to stressors at work. His supervisor told him he think he loss his passion for work which hurt patient's feelings and very tearful and anxious. He felt like he could not return back to work due to the tension and stress of the environment. He also had feelings of helplessness and hopelessness. No thought of death or dying. He reported his wife is very supportive and he was planning to se a Social worker. Mentioned of having a psychiatrist.   - medication : pristiq 100 mg daily , gbapentin 300 mg twice daily and used alprazolam only for flights and clonazepam  0.25 mg x3 daily through psychiatry. Desyrel 50 mg as needed for depression My notes:   Counseling: patient had been seen in the past. Had been trying to get established again looking for male provider but was difficult. I gave the name for [facility] and if he is not able to get in soon recommended considering tela doc or similar service.  -Significant worsening of GAD 7 score up to 16 from baseline of less than 5- Want him to return to under 5  if possible prior to returning to work as work and of itself may worsen anxiety -depression appeared  reasonably controlled but this has potential to worsen if anxiety is not better controlled  -he is going to keep me updated on psych visits  GAD 7 : Generalized Anxiety Score 03/15/2021 01/26/2020  Nervous, Anxious, on Edge 3 1  Control/stop worrying 3 1  Worry too much - different things 3 1  Trouble relaxing 3 1  Restless 0 0  Easily annoyed or irritable 1 0  Afraid - awful might happen 3 0  Total GAD 7 Score 16 4  Anxiety Difficulty Extremely difficult Not difficult at all   A/P: ***   #hypertension S: medication: losartan hctz 100-12.5 mg, propranolol 20 mg x3 daily Home readings #s: *** BP Readings from Last 3 Encounters:  03/15/21 135/81  03/01/21 (!) 157/108  05/07/20 126/84  A/P: ***   *** Health Maintenance Due  Topic Date Due   Hepatitis C Screening  Never done   INFLUENZA VACCINE  04/15/2021    Recommended follow up: No follow-ups on file. Future Appointments  Date Time Provider [place]  05/06/2021  1:00 PM Marin Olp, MD [facility]    Lab/Order associations: No diagnosis found.  No orders of the defined types were placed in this encounter.   I,Jada Bradford,acting as a scribe for Garret Reddish, MD.,have documented all relevant documentation on the behalf of Garret Reddish, MD,as directed by  Garret Reddish, MD while in the presence of Garret Reddish, MD.  ***  Return precautions advised.  Burnett Corrente

## 2021-05-06 ENCOUNTER — Ambulatory Visit: Payer: No Typology Code available for payment source | Admitting: Family Medicine

## 2021-05-06 DIAGNOSIS — I10 Essential (primary) hypertension: Secondary | ICD-10-CM

## 2021-05-06 DIAGNOSIS — F411 Generalized anxiety disorder: Secondary | ICD-10-CM

## 2021-06-24 DIAGNOSIS — F4010 Social phobia, unspecified: Secondary | ICD-10-CM | POA: Diagnosis not present

## 2021-06-24 DIAGNOSIS — F411 Generalized anxiety disorder: Secondary | ICD-10-CM | POA: Diagnosis not present

## 2021-06-24 DIAGNOSIS — Z79899 Other long term (current) drug therapy: Secondary | ICD-10-CM | POA: Diagnosis not present

## 2021-06-24 DIAGNOSIS — Z8659 Personal history of other mental and behavioral disorders: Secondary | ICD-10-CM | POA: Diagnosis not present

## 2021-07-03 DIAGNOSIS — D485 Neoplasm of uncertain behavior of skin: Secondary | ICD-10-CM | POA: Diagnosis not present

## 2021-07-03 DIAGNOSIS — L820 Inflamed seborrheic keratosis: Secondary | ICD-10-CM | POA: Diagnosis not present

## 2021-09-27 DIAGNOSIS — Z79899 Other long term (current) drug therapy: Secondary | ICD-10-CM | POA: Diagnosis not present

## 2021-09-27 DIAGNOSIS — Z8659 Personal history of other mental and behavioral disorders: Secondary | ICD-10-CM | POA: Diagnosis not present

## 2021-09-27 DIAGNOSIS — F4010 Social phobia, unspecified: Secondary | ICD-10-CM | POA: Diagnosis not present

## 2021-09-27 DIAGNOSIS — F411 Generalized anxiety disorder: Secondary | ICD-10-CM | POA: Diagnosis not present

## 2021-10-09 ENCOUNTER — Encounter: Payer: Self-pay | Admitting: Family Medicine

## 2022-01-09 DIAGNOSIS — F411 Generalized anxiety disorder: Secondary | ICD-10-CM | POA: Diagnosis not present

## 2022-01-09 DIAGNOSIS — Z79899 Other long term (current) drug therapy: Secondary | ICD-10-CM | POA: Diagnosis not present

## 2022-01-09 DIAGNOSIS — F4010 Social phobia, unspecified: Secondary | ICD-10-CM | POA: Diagnosis not present

## 2022-04-03 ENCOUNTER — Other Ambulatory Visit: Payer: Self-pay | Admitting: Family Medicine

## 2022-05-21 ENCOUNTER — Encounter: Payer: Self-pay | Admitting: Family Medicine

## 2022-05-21 ENCOUNTER — Other Ambulatory Visit: Payer: Self-pay

## 2022-05-21 MED ORDER — LOSARTAN POTASSIUM-HCTZ 100-12.5 MG PO TABS: 1.0000 | ORAL_TABLET | Freq: Every day | ORAL | 0 refills | Status: DC

## 2022-06-01 ENCOUNTER — Other Ambulatory Visit: Payer: Self-pay | Admitting: Family Medicine

## 2022-06-04 ENCOUNTER — Ambulatory Visit (INDEPENDENT_AMBULATORY_CARE_PROVIDER_SITE_OTHER): Payer: BC Managed Care – PPO | Admitting: Family Medicine

## 2022-06-04 ENCOUNTER — Encounter: Payer: Self-pay | Admitting: Family Medicine

## 2022-06-04 VITALS — BP 130/90 | HR 74 | Temp 97.3°F | Ht 67.0 in | Wt 181.6 lb

## 2022-06-04 DIAGNOSIS — I10 Essential (primary) hypertension: Secondary | ICD-10-CM | POA: Diagnosis not present

## 2022-06-04 DIAGNOSIS — Z125 Encounter for screening for malignant neoplasm of prostate: Secondary | ICD-10-CM

## 2022-06-04 DIAGNOSIS — Z0000 Encounter for general adult medical examination without abnormal findings: Secondary | ICD-10-CM

## 2022-06-04 MED ORDER — LOSARTAN POTASSIUM-HCTZ 100-12.5 MG PO TABS: 1.0000 | ORAL_TABLET | Freq: Every day | ORAL | 3 refills | Status: DC

## 2022-06-04 NOTE — Progress Notes (Signed)
Phone: [phone]    Subjective:  Patient presents today for their annual physical. Chief complaint-noted.   See problem oriented charting- ROS- full  review of systems was completed and negative  except for: anxiety, allergies  The following were reviewed and entered/updated in epic: Past Medical History:  Diagnosis Date   Allergy    nasal lavave 3x a day. in past multiple meds- claritin, singulair, nasal spray.    Depression    wellbutrin 150mg  XR, pristiq 100mg . ambien 10mg  for sleep. clonazepam 1 tablet TID prn   Fear of flying    GAD (generalized anxiety disorder)    wellbutrin 150mg  XR, pristiq 100mg . ambien 10mg  for sleep. clonazepam 1 tablet TID prn   History of Clostridium difficile infection    Hypertension    losartan HCT 100-12.5mg  daily   Leukopenia    Low libido    OCD (obsessive compulsive disorder)    Panic disorder    Patient Active Problem List   Diagnosis Date Noted   GAD (generalized anxiety disorder)     Priority: High   History of adenomatous polyp of colon 11/24/2018    Priority: Medium    Leukopenia 12/16/2017    Priority: Medium    Allergic rhinitis 12/16/2017    Priority: Medium    Low libido 12/16/2017    Priority: Medium    IBS (irritable bowel syndrome) 12/15/2016    Priority: Medium    Depression     Priority: Medium    Hypertension     Priority: Medium    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 precursor to multiple myeloma.    Multiple myeloma Mother    Hypertension Father    Anxiety disorder Father    Colon polyps Father        tubularvillous adenoma   Lupus Sister    Prostate cancer Maternal Grandfather        in 30s    Prostate cancer Paternal Grandfather        in 59s   Stomach cancer Neg Hx    Pancreatic cancer Neg Hx     Medications- reviewed and updated Current Outpatient Medications  Medication Sig Dispense Refill   ALPRAZolam (XANAX) 0.5 MG tablet Take 0.5 mg by mouth as needed. Takes prior to flights     Azelastine-Fluticasone 137-50 MCG/ACT SUSP SPRAY 1 SPRAY INTO EACH NOSTRIL TWICE A DAY 23 g 2   clonazePAM (KLONOPIN) 0.5 MG tablet Take 1 tablet in AM, half tablet in afternoon, 1 tablet in PM (Patient taking differently: Take 1/2 tablet in AM and 1/2 tablet in PM) 4 tablet 0   desvenlafaxine (PRISTIQ) 100 MG 24 hr tablet Take 100 mg by mouth daily.     gabapentin (NEURONTIN) 300 MG capsule Take 1 capsule (300 mg total) by mouth 2 (two) times daily. 90 capsule 3   propranolol (INDERAL) 20 MG tablet Take 20 mg by mouth 3 (three) times daily.     sildenafil (VIAGRA) 100 MG tablet Take 1 tablet (100 mg total) by mouth daily as needed for erectile dysfunction. 10 tablet 5   traZODone (DESYREL)  50 MG tablet Take 25 mg by mouth at bedtime as needed for sleep.      buPROPion (WELLBUTRIN XL) 150 MG 24 hr tablet Take 150 mg by mouth daily. Through psychiatry     losartan-hydrochlorothiazide (HYZAAR) 100-12.5 MG tablet Take 1 tablet by mouth daily. 90 tablet 3   No current facility-administered medications for this visit.    Allergies-reviewed and updated Allergies  Allergen Reactions   Doxycycline Nausea And Vomiting    Social History   Social History Narrative   Married 2002. 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 Oropharynx normal Neck: no thyromegaly CV: RRR no murmurs rubs or gallops Lungs: CTAB no crackles, wheeze, rhonchi Abdomen: soft/nontender/nondistended/normal bowel sounds. No rebound or guarding.  Ext: no edema Skin: warm, dry Neuro: grossly normal, moves all extremities, PERRLA     Assessment and Plan:  46 y.o. male presenting for annual physical.  Health Maintenance counseling: 1. Anticipatory guidance: Patient counseled regarding regular dental exams -q6 months, eye exams -wants to update full exam- some changes to vision,  avoiding smoking and second hand smoke , limiting alcohol to 2 beverages per day- 5 per week, no illicit drugs.   2. Risk factor reduction:  Advised patient of need for regular exercise and diet rich and fruits and vegetables to reduce risk of heart attack and stroke.  Exercise- excellent- see below under anxiety. Walks dogs Diet/weight management-within 3 lbs of last year- on home scales 175 has generally stayed in this range- could push overall range down 5 lbs. Tries to eat when hungry and eat variety of food- does hello fresh. Tries to minimize sugar.  Wt Readings from Last 3 Encounters:  06/04/22 181 lb 9.6 oz (82.4 kg)  03/01/21 179 lb 3.2 oz (81.3 kg)  05/07/20 178 lb (80.7 kg)  3. Immunizations/screenings/ancillary studies- HCV screen declines Immunization History  Administered Date(s) Administered   Influenza,inj,Quad PF,6+ Mos 06/17/2017, 10/04/2018, 08/20/2019   Influenza-Unspecified 07/30/2016   PFIZER(Purple Top)SARS-COV-2 Vaccination 11/16/2019, 12/14/2019, 06/24/2020   Tdap 02/11/2017  4. Prostate cancer screening-  with family history in grandparents he wants to check at this time- start psa with labs. Opts out of psa for now 5. Colon cancer screening -  colonoscopy early in 52s- has 5 year repeat planned in 2025 6. Skin cancer screening/prevention- yearly for mole check [facility]. advised regular sunscreen use. Denies worrisome, changing, or new skin lesions.  7. Testicular cancer screening- advised monthly self exams  8. STD screening- patient opts out- only active with wife 36. 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 He is doing much better with this change- seemed to  help  #hypertension S: medication: losartan hctz 100-12.5 mg Home readings #s: 130/90 at home usually. Checked this morning and was 130/90  but can be 114/80 BP Readings from Last 3 Encounters:  06/04/22 (!) 130/90  03/15/21 135/81  03/01/21 (!) 157/108  A/P: blood pressure mildly home on most recent home check. Prefer <135/85 average at home- he is going to check over 10 days and update me by [employer]- will continue current meds if under this goal and suspect it may be with some #s as low as 110s over 80  # Anxiety/depression S:Medication: alprazolam prior to flights , clonazepam 0.25 mg (down from 0.5 mg) 3x a day, pristiq $RemoveBefo'100mg'upVaRTmvzsb$ , gabapentin $RemoveBefo'300mg'NLybdzWobrw$  BID through psychiatry.   -rare trazodone before bed -propranolol does only as needed for anxiety Counseling: not at present but insurance issues- had large bill in past -tires to walk, do strength training and does 5 minute walks with daughter A/P: depression/anxiety- overall stable- continue current meds  -could consider emdr/eye spotting #hyperlipidemia S: Medication:none  Lab Results  Component Value Date   CHOL 261 (H) 01/26/2020   HDL 84.00 01/26/2020   LDLCALC 157 (H) 01/26/2020   TRIG 100.0 01/26/2020   CHOLHDL 3 01/26/2020   A/P: has not been in range for medicine but mild to moderately high- update with labs today  #WBC chronically mildly low for years-  update with labs- rest of cell lines reassuring - slightly low solidum 2021- update today  Recommended follow up: Return in about 6 months (around 12/03/2022) for followup or sooner if needed.Schedule b4 you leave.  Lab/Order associations: fasting   ICD-10-CM   1. Preventative health care  Z00.00 CBC with Differential/Platelet    Comprehensive metabolic panel    Lipid panel    PSA    2. Primary hypertension  I10 CBC with Differential/Platelet    Comprehensive metabolic panel    Lipid panel    3. Screening for prostate cancer  Z12.5 PSA      Meds ordered this encounter  Medications   losartan-hydrochlorothiazide (HYZAAR) 100-12.5 MG tablet    Sig: Take 1 tablet by mouth daily.    Dispense:  90 tablet    Refill:  3    Return precautions advised.   Garret Reddish, MD

## 2022-06-04 NOTE — Patient Instructions (Addendum)
[email or web address] - could consider other options for emdr or brain spotting as well if wanted to look at different form of therapy  blood pressure mildly home on most recent home check. Prefer <135/85 average at home- he is going to check over 10 days and update me by [employer]- will continue current meds if under this goal and suspect it may be with some #s as low as 110s over 80  Schedule a lab visit at the check out desk within 2 weeks. Return for future fasting labs meaning nothing but water after midnight please. Ok to take your medications with water.   Recommended follow up: Return in about 6 months (around 12/03/2022) for followup or sooner if needed.Schedule b4 you leave. -can also schedule yearly

## 2022-06-15 ENCOUNTER — Other Ambulatory Visit: Payer: Self-pay | Admitting: Family Medicine

## 2022-06-18 ENCOUNTER — Other Ambulatory Visit: Payer: BC Managed Care – PPO

## 2022-06-23 ENCOUNTER — Encounter: Payer: Self-pay | Admitting: Family Medicine

## 2022-06-24 DIAGNOSIS — F411 Generalized anxiety disorder: Secondary | ICD-10-CM | POA: Diagnosis not present

## 2022-06-24 DIAGNOSIS — F40243 Fear of flying: Secondary | ICD-10-CM | POA: Diagnosis not present

## 2022-06-24 DIAGNOSIS — F4010 Social phobia, unspecified: Secondary | ICD-10-CM | POA: Diagnosis not present

## 2022-06-24 DIAGNOSIS — Z79899 Other long term (current) drug therapy: Secondary | ICD-10-CM | POA: Diagnosis not present

## 2022-06-25 ENCOUNTER — Other Ambulatory Visit: Payer: BC Managed Care – PPO

## 2022-07-28 DIAGNOSIS — F40243 Fear of flying: Secondary | ICD-10-CM | POA: Diagnosis not present

## 2022-07-28 DIAGNOSIS — F411 Generalized anxiety disorder: Secondary | ICD-10-CM | POA: Diagnosis not present

## 2022-07-28 DIAGNOSIS — Z79899 Other long term (current) drug therapy: Secondary | ICD-10-CM | POA: Diagnosis not present

## 2022-07-28 DIAGNOSIS — F4010 Social phobia, unspecified: Secondary | ICD-10-CM | POA: Diagnosis not present

## 2022-08-25 DIAGNOSIS — F4010 Social phobia, unspecified: Secondary | ICD-10-CM | POA: Diagnosis not present

## 2022-08-25 DIAGNOSIS — Z79899 Other long term (current) drug therapy: Secondary | ICD-10-CM | POA: Diagnosis not present

## 2022-08-25 DIAGNOSIS — F411 Generalized anxiety disorder: Secondary | ICD-10-CM | POA: Diagnosis not present

## 2022-10-06 DIAGNOSIS — Z79899 Other long term (current) drug therapy: Secondary | ICD-10-CM | POA: Diagnosis not present

## 2022-10-06 DIAGNOSIS — F411 Generalized anxiety disorder: Secondary | ICD-10-CM | POA: Diagnosis not present

## 2022-10-06 DIAGNOSIS — F4010 Social phobia, unspecified: Secondary | ICD-10-CM | POA: Diagnosis not present

## 2022-11-25 ENCOUNTER — Encounter: Payer: Self-pay | Admitting: Family Medicine

## 2022-12-03 ENCOUNTER — Ambulatory Visit: Payer: BC Managed Care – PPO | Admitting: Family Medicine

## 2022-12-03 DIAGNOSIS — F411 Generalized anxiety disorder: Secondary | ICD-10-CM | POA: Diagnosis not present

## 2022-12-03 DIAGNOSIS — Z79899 Other long term (current) drug therapy: Secondary | ICD-10-CM | POA: Diagnosis not present

## 2022-12-03 DIAGNOSIS — F4010 Social phobia, unspecified: Secondary | ICD-10-CM | POA: Diagnosis not present

## 2023-02-10 ENCOUNTER — Ambulatory Visit: Payer: BC Managed Care – PPO | Admitting: Family Medicine

## 2023-02-11 DIAGNOSIS — F411 Generalized anxiety disorder: Secondary | ICD-10-CM | POA: Diagnosis not present

## 2023-02-11 DIAGNOSIS — F4010 Social phobia, unspecified: Secondary | ICD-10-CM | POA: Diagnosis not present

## 2023-02-11 DIAGNOSIS — F40243 Fear of flying: Secondary | ICD-10-CM | POA: Diagnosis not present

## 2023-03-05 ENCOUNTER — Encounter: Payer: Self-pay | Admitting: Family Medicine

## 2023-04-15 DIAGNOSIS — F411 Generalized anxiety disorder: Secondary | ICD-10-CM | POA: Diagnosis not present

## 2023-04-15 DIAGNOSIS — F4010 Social phobia, unspecified: Secondary | ICD-10-CM | POA: Diagnosis not present

## 2023-06-02 ENCOUNTER — Encounter: Payer: Self-pay | Admitting: Family Medicine

## 2023-06-02 ENCOUNTER — Other Ambulatory Visit: Payer: Self-pay

## 2023-06-02 MED ORDER — LOSARTAN POTASSIUM-HCTZ 100-12.5 MG PO TABS: 1.0000 | ORAL_TABLET | Freq: Every day | ORAL | 3 refills | Status: DC

## 2023-07-15 DIAGNOSIS — F411 Generalized anxiety disorder: Secondary | ICD-10-CM | POA: Diagnosis not present

## 2023-07-15 DIAGNOSIS — F4010 Social phobia, unspecified: Secondary | ICD-10-CM | POA: Diagnosis not present

## 2023-08-03 ENCOUNTER — Encounter: Payer: Self-pay | Admitting: Family Medicine

## 2023-08-19 ENCOUNTER — Ambulatory Visit: Payer: BC Managed Care – PPO | Admitting: Family Medicine

## 2023-09-29 ENCOUNTER — Ambulatory Visit: Payer: BC Managed Care – PPO | Admitting: Family Medicine

## 2023-09-30 ENCOUNTER — Other Ambulatory Visit: Payer: Self-pay | Admitting: Family Medicine

## 2023-11-04 DIAGNOSIS — F410 Panic disorder [episodic paroxysmal anxiety] without agoraphobia: Secondary | ICD-10-CM | POA: Diagnosis not present

## 2023-11-04 DIAGNOSIS — F4010 Social phobia, unspecified: Secondary | ICD-10-CM | POA: Diagnosis not present

## 2023-11-04 DIAGNOSIS — F411 Generalized anxiety disorder: Secondary | ICD-10-CM | POA: Diagnosis not present

## 2023-11-18 ENCOUNTER — Ambulatory Visit: Payer: BC Managed Care – PPO | Admitting: Family Medicine

## 2024-02-03 DIAGNOSIS — F411 Generalized anxiety disorder: Secondary | ICD-10-CM | POA: Diagnosis not present

## 2024-02-03 DIAGNOSIS — F410 Panic disorder [episodic paroxysmal anxiety] without agoraphobia: Secondary | ICD-10-CM | POA: Diagnosis not present

## 2024-02-03 DIAGNOSIS — F40243 Fear of flying: Secondary | ICD-10-CM | POA: Diagnosis not present

## 2024-02-23 ENCOUNTER — Ambulatory Visit: Payer: BC Managed Care – PPO | Admitting: Family Medicine

## 2024-04-29 ENCOUNTER — Ambulatory Visit: Admitting: Family Medicine

## 2024-05-12 DIAGNOSIS — F4010 Social phobia, unspecified: Secondary | ICD-10-CM | POA: Diagnosis not present

## 2024-05-12 DIAGNOSIS — F411 Generalized anxiety disorder: Secondary | ICD-10-CM | POA: Diagnosis not present
